# Patient Record
Sex: Male | Born: 1995 | Race: Black or African American | Hispanic: No | Marital: Single | Smoking: Current every day smoker
Health system: Southern US, Community
[De-identification: ages and names within clinical notes are randomized; demographics above are authoritative.]

## PROBLEM LIST (undated history)

## (undated) DIAGNOSIS — F329 Major depressive disorder, single episode, unspecified: Secondary | ICD-10-CM

## (undated) DIAGNOSIS — F32A Depression, unspecified: Secondary | ICD-10-CM

## (undated) DIAGNOSIS — F4310 Post-traumatic stress disorder, unspecified: Secondary | ICD-10-CM

## (undated) DIAGNOSIS — F209 Schizophrenia, unspecified: Secondary | ICD-10-CM

---
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
## 2015-03-07 ENCOUNTER — Encounter (HOSPITAL_COMMUNITY): Payer: Self-pay | Admitting: Emergency Medicine

## 2015-03-07 ENCOUNTER — Emergency Department (HOSPITAL_COMMUNITY): Payer: No Typology Code available for payment source

## 2015-03-07 ENCOUNTER — Emergency Department (HOSPITAL_COMMUNITY)
Admission: EM | Admit: 2015-03-07 | Discharge: 2015-03-07 | Disposition: A | Discharge status: Home / Self Care | Payer: No Typology Code available for payment source | Attending: Emergency Medicine | Admitting: Emergency Medicine

## 2015-03-07 DIAGNOSIS — Z720 Tobacco use: Secondary | ICD-10-CM | POA: Insufficient documentation

## 2015-03-07 DIAGNOSIS — S161XXA Strain of muscle, fascia and tendon at neck level, initial encounter: Secondary | ICD-10-CM | POA: Diagnosis not present

## 2015-03-07 DIAGNOSIS — Y998 Other external cause status: Secondary | ICD-10-CM | POA: Diagnosis not present

## 2015-03-07 DIAGNOSIS — S0990XA Unspecified injury of head, initial encounter: Secondary | ICD-10-CM | POA: Insufficient documentation

## 2015-03-07 DIAGNOSIS — Y9389 Activity, other specified: Secondary | ICD-10-CM | POA: Diagnosis not present

## 2015-03-07 DIAGNOSIS — Y92410 Unspecified street and highway as the place of occurrence of the external cause: Secondary | ICD-10-CM | POA: Insufficient documentation

## 2015-03-07 DIAGNOSIS — Z8659 Personal history of other mental and behavioral disorders: Secondary | ICD-10-CM | POA: Diagnosis not present

## 2015-03-07 DIAGNOSIS — S199XXA Unspecified injury of neck, initial encounter: Secondary | ICD-10-CM | POA: Diagnosis present

## 2015-03-07 HISTORY — DX: Major depressive disorder, single episode, unspecified: F32.9

## 2015-03-07 HISTORY — DX: Depression, unspecified: F32.A

## 2015-03-07 MED ORDER — IBUPROFEN 800 MG PO TABS: 800.0000 mg | ORAL_TABLET | Freq: Three times a day (TID) | ORAL | Status: DC

## 2015-03-07 MED ORDER — CYCLOBENZAPRINE HCL 10 MG PO TABS: 10.0000 mg | ORAL_TABLET | Freq: Two times a day (BID) | ORAL | Status: DC | PRN
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
## 2015-03-07 NOTE — Discharge Instructions (Signed)
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
## 2021-11-05 ENCOUNTER — Emergency Department (HOSPITAL_COMMUNITY): Payer: Medicaid Other

## 2021-11-05 ENCOUNTER — Inpatient Hospital Stay (HOSPITAL_COMMUNITY)
Admission: EM | Admit: 2021-11-05 | Discharge: 2021-11-14 | DRG: 957 | Disposition: A | Discharge status: Home / Self Care | Payer: Medicaid Other | Attending: General Surgery | Admitting: General Surgery

## 2021-11-05 ENCOUNTER — Other Ambulatory Visit: Payer: Self-pay

## 2021-11-05 ENCOUNTER — Inpatient Hospital Stay (HOSPITAL_COMMUNITY): Payer: Medicaid Other

## 2021-11-05 ENCOUNTER — Encounter (HOSPITAL_COMMUNITY): Admission: EM | Disposition: A | Discharge status: Home / Self Care | Payer: Self-pay | Source: Home / Self Care

## 2021-11-05 ENCOUNTER — Emergency Department (HOSPITAL_COMMUNITY): Payer: Medicaid Other | Admitting: Anesthesiology

## 2021-11-05 DIAGNOSIS — W3400XA Accidental discharge from unspecified firearms or gun, initial encounter: Principal | ICD-10-CM

## 2021-11-05 DIAGNOSIS — F259 Schizoaffective disorder, unspecified: Secondary | ICD-10-CM | POA: Diagnosis present

## 2021-11-05 DIAGNOSIS — S36299A Other injury of unspecified part of pancreas, initial encounter: Secondary | ICD-10-CM | POA: Diagnosis not present

## 2021-11-05 DIAGNOSIS — K8689 Other specified diseases of pancreas: Secondary | ICD-10-CM | POA: Diagnosis present

## 2021-11-05 DIAGNOSIS — S37062A Major laceration of left kidney, initial encounter: Secondary | ICD-10-CM | POA: Diagnosis present

## 2021-11-05 DIAGNOSIS — Z23 Encounter for immunization: Secondary | ICD-10-CM

## 2021-11-05 DIAGNOSIS — R748 Abnormal levels of other serum enzymes: Secondary | ICD-10-CM | POA: Diagnosis present

## 2021-11-05 DIAGNOSIS — S36531A Laceration of transverse colon, initial encounter: Secondary | ICD-10-CM | POA: Diagnosis present

## 2021-11-05 DIAGNOSIS — K567 Ileus, unspecified: Secondary | ICD-10-CM | POA: Diagnosis not present

## 2021-11-05 DIAGNOSIS — S36292A Other injury of tail of pancreas, initial encounter: Secondary | ICD-10-CM | POA: Diagnosis present

## 2021-11-05 DIAGNOSIS — S36591A Other injury of transverse colon, initial encounter: Secondary | ICD-10-CM | POA: Diagnosis not present

## 2021-11-05 DIAGNOSIS — K658 Other peritonitis: Secondary | ICD-10-CM | POA: Diagnosis present

## 2021-11-05 DIAGNOSIS — S2232XA Fracture of one rib, left side, initial encounter for closed fracture: Secondary | ICD-10-CM | POA: Diagnosis present

## 2021-11-05 DIAGNOSIS — S31641A Puncture wound with foreign body of abdominal wall, left upper quadrant with penetration into peritoneal cavity, initial encounter: Secondary | ICD-10-CM | POA: Diagnosis present

## 2021-11-05 DIAGNOSIS — Z933 Colostomy status: Secondary | ICD-10-CM

## 2021-11-05 DIAGNOSIS — S31631A Puncture wound without foreign body of abdominal wall, left upper quadrant with penetration into peritoneal cavity, initial encounter: Secondary | ICD-10-CM | POA: Diagnosis present

## 2021-11-05 HISTORY — PX: PARTIAL COLECTOMY: SHX5273

## 2021-11-05 HISTORY — PX: COLOSTOMY: SHX63

## 2021-11-05 HISTORY — PX: LAPAROTOMY: SHX154

## 2021-11-05 HISTORY — PX: SPLENECTOMY, TOTAL: SHX788

## 2021-11-05 LAB — URINALYSIS, ROUTINE W REFLEX MICROSCOPIC
Bilirubin Urine: NEGATIVE
Glucose, UA: NEGATIVE mg/dL
Ketones, ur: NEGATIVE mg/dL
Leukocytes,Ua: NEGATIVE
Nitrite: NEGATIVE
Protein, ur: 30 mg/dL — AB
RBC / HPF: >50 RBC/hpf — ABNORMAL HIGH (ref 0–5)
Specific Gravity, Urine: 1.013 (ref 1.005–1.030)
pH: 9 — ABNORMAL HIGH (ref 5.0–8.0)

## 2021-11-05 LAB — COMPREHENSIVE METABOLIC PANEL
ALT: 16 U/L (ref 0–44)
AST: 24 U/L (ref 15–41)
Albumin: 3.4 g/dL — ABNORMAL LOW (ref 3.5–5.0)
Alkaline Phosphatase: 80 U/L (ref 38–126)
Anion gap: 7 (ref 5–15)
BUN: 8 mg/dL (ref 6–20)
CO2: 23 mmol/L (ref 22–32)
Calcium: 8.3 mg/dL — ABNORMAL LOW (ref 8.9–10.3)
Chloride: 110 mmol/L (ref 98–111)
Creatinine, Ser: 1.01 mg/dL (ref 0.61–1.24)
GFR, Estimated: >60 mL/min (ref >60)
Glucose, Bld: 90 mg/dL (ref 70–99)
Potassium: 4.3 mmol/L (ref 3.5–5.1)
Sodium: 140 mmol/L (ref 135–145)
Total Bilirubin: 0.5 mg/dL (ref 0.3–1.2)
Total Protein: 5.5 g/dL — ABNORMAL LOW (ref 6.5–8.1)

## 2021-11-05 LAB — TYPE AND SCREEN
ABO/RH(D): B POS
Antibody Screen: NEGATIVE

## 2021-11-05 LAB — CBC
HCT: 43.7 % (ref 39.0–52.0)
Hemoglobin: 14.2 g/dL (ref 13.0–17.0)
MCH: 29.3 pg (ref 26.0–34.0)
MCHC: 32.5 g/dL (ref 30.0–36.0)
MCV: 90.1 fL (ref 80.0–100.0)
Platelets: 393 10*3/uL (ref 150–400)
RBC: 4.85 MIL/uL (ref 4.22–5.81)
RDW: 13.6 % (ref 11.5–15.5)
WBC: 12 10*3/uL — ABNORMAL HIGH (ref 4.0–10.5)
nRBC: 0 % (ref 0.0–0.2)

## 2021-11-05 LAB — ABO/RH: ABO/RH(D): B POS

## 2021-11-05 LAB — SAMPLE TO BLOOD BANK

## 2021-11-05 LAB — I-STAT CHEM 8, ED
BUN: 8 mg/dL (ref 6–20)
Calcium, Ion: 1 mmol/L — ABNORMAL LOW (ref 1.15–1.40)
Chloride: 106 mmol/L (ref 98–111)
Creatinine, Ser: 1 mg/dL (ref 0.61–1.24)
Glucose, Bld: 89 mg/dL (ref 70–99)
HCT: 42 % (ref 39.0–52.0)
Hemoglobin: 14.3 g/dL (ref 13.0–17.0)
Potassium: 4.2 mmol/L (ref 3.5–5.1)
Sodium: 139 mmol/L (ref 135–145)
TCO2: 24 mmol/L (ref 22–32)

## 2021-11-05 LAB — PROTIME-INR
INR: 1.1 (ref 0.8–1.2)
Prothrombin Time: 14.3 seconds (ref 11.4–15.2)

## 2021-11-05 LAB — LACTIC ACID, PLASMA: Lactic Acid, Venous: 2.1 mmol/L (ref 0.5–1.9)

## 2021-11-05 LAB — ETHANOL: Alcohol, Ethyl (B): <10 mg/dL (ref <10)

## 2021-11-05 SURGERY — COLECTOMY, PARTIAL
Anesthesia: General | Site: Abdomen

## 2021-11-05 MED ORDER — HALOPERIDOL LACTATE 5 MG/ML IJ SOLN: 5.0000 mg | Freq: Four times a day (QID) | INTRAMUSCULAR | Status: DC | PRN

## 2021-11-05 MED ORDER — CEFAZOLIN SODIUM-DEXTROSE 2-4 GM/100ML-% IV SOLN
2.0000 g | Freq: Once | INTRAVENOUS | Status: AC
  Administered 2021-11-05: 2 g via INTRAVENOUS

## 2021-11-05 MED ORDER — AMISULPRIDE (ANTIEMETIC) 5 MG/2ML IV SOLN: 10.0000 mg | Freq: Once | INTRAVENOUS | Status: DC | PRN

## 2021-11-05 MED ORDER — PROPOFOL 10 MG/ML IV BOLUS
INTRAVENOUS | Status: AC
  Filled 2021-11-05: qty 20

## 2021-11-05 MED ORDER — PANTOPRAZOLE SODIUM 40 MG PO TBEC: 40.0000 mg | DELAYED_RELEASE_TABLET | Freq: Every day | ORAL | Status: DC

## 2021-11-05 MED ORDER — HYDROMORPHONE HCL 1 MG/ML IJ SOLN
1.0000 mg | INTRAMUSCULAR | Status: DC | PRN
  Administered 2021-11-05: 2 mg via INTRAVENOUS
  Filled 2021-11-05: qty 2

## 2021-11-05 MED ORDER — MORPHINE SULFATE (PF) 4 MG/ML IV SOLN: 4.0000 mg | INTRAVENOUS | Status: DC | PRN

## 2021-11-05 MED ORDER — FENTANYL CITRATE PF 50 MCG/ML IJ SOSY
50.0000 ug | PREFILLED_SYRINGE | Freq: Once | INTRAMUSCULAR | Status: AC
  Administered 2021-11-05: 50 ug via INTRAVENOUS

## 2021-11-05 MED ORDER — OXYCODONE HCL 5 MG/5ML PO SOLN: 5.0000 mg | Freq: Once | ORAL | Status: DC | PRN

## 2021-11-05 MED ORDER — OXYCODONE HCL 5 MG PO TABS: 5.0000 mg | ORAL_TABLET | ORAL | Status: DC | PRN

## 2021-11-05 MED ORDER — LACTATED RINGERS IV SOLN: INTRAVENOUS | Status: DC

## 2021-11-05 MED ORDER — METHOCARBAMOL 1000 MG/10ML IJ SOLN
1000.0000 mg | Freq: Three times a day (TID) | INTRAVENOUS | Status: DC
  Administered 2021-11-05 – 2021-11-08 (×8): 1000 mg via INTRAVENOUS
  Filled 2021-11-05 (×3): qty 10
  Filled 2021-11-05: qty 1000
  Filled 2021-11-05 (×6): qty 10

## 2021-11-05 MED ORDER — DEXAMETHASONE SODIUM PHOSPHATE 10 MG/ML IJ SOLN
INTRAMUSCULAR | Status: AC
  Filled 2021-11-05: qty 2

## 2021-11-05 MED ORDER — SUGAMMADEX SODIUM 200 MG/2ML IV SOLN
INTRAVENOUS | Status: DC | PRN
  Administered 2021-11-05: 200 mg via INTRAVENOUS

## 2021-11-05 MED ORDER — PANTOPRAZOLE SODIUM 40 MG IV SOLR
40.0000 mg | Freq: Every day | INTRAVENOUS | Status: DC
  Administered 2021-11-05: 40 mg via INTRAVENOUS
  Filled 2021-11-05: qty 10

## 2021-11-05 MED ORDER — ADULT MULTIVITAMIN W/MINERALS CH
1.0000 | ORAL_TABLET | Freq: Every day | ORAL | Status: DC
  Administered 2021-11-08 – 2021-11-14 (×6): 1 via ORAL
  Filled 2021-11-05 (×8): qty 1

## 2021-11-05 MED ORDER — METOPROLOL TARTRATE 5 MG/5ML IV SOLN: 5.0000 mg | Freq: Four times a day (QID) | INTRAVENOUS | Status: DC | PRN

## 2021-11-05 MED ORDER — DEXMEDETOMIDINE (PRECEDEX) IN NS 20 MCG/5ML (4 MCG/ML) IV SYRINGE
PREFILLED_SYRINGE | INTRAVENOUS | Status: DC | PRN
  Administered 2021-11-05: 10 ug via INTRAVENOUS
  Administered 2021-11-05: 20 ug via INTRAVENOUS
  Administered 2021-11-05: 10 ug via INTRAVENOUS

## 2021-11-05 MED ORDER — ROCURONIUM BROMIDE 10 MG/ML (PF) SYRINGE
PREFILLED_SYRINGE | INTRAVENOUS | Status: DC | PRN
  Administered 2021-11-05: 100 mg via INTRAVENOUS

## 2021-11-05 MED ORDER — ONDANSETRON 4 MG PO TBDP: 4.0000 mg | ORAL_TABLET | Freq: Four times a day (QID) | ORAL | Status: DC | PRN

## 2021-11-05 MED ORDER — OXYCODONE HCL 5 MG PO TABS: 5.0000 mg | ORAL_TABLET | Freq: Once | ORAL | Status: DC | PRN

## 2021-11-05 MED ORDER — HYDROMORPHONE HCL 1 MG/ML IJ SOLN
INTRAMUSCULAR | Status: DC | PRN
  Administered 2021-11-05 (×2): .5 mg via INTRAVENOUS

## 2021-11-05 MED ORDER — MENINGOCOCCAL A C Y&W-135 OLIG IM SOLR
0.5000 mL | INTRAMUSCULAR | Status: AC | PRN
  Administered 2021-11-14: 0.5 mL via INTRAMUSCULAR
  Filled 2021-11-05 (×2): qty 0.5

## 2021-11-05 MED ORDER — FENTANYL CITRATE (PF) 100 MCG/2ML IJ SOLN
INTRAMUSCULAR | Status: AC
Start: 2021-11-05 — End: 2021-11-06
  Filled 2021-11-05: qty 2

## 2021-11-05 MED ORDER — ONDANSETRON HCL 4 MG/2ML IJ SOLN
INTRAMUSCULAR | Status: AC
  Filled 2021-11-05: qty 2

## 2021-11-05 MED ORDER — LIDOCAINE 2% (20 MG/ML) 5 ML SYRINGE
INTRAMUSCULAR | Status: AC
  Filled 2021-11-05: qty 5

## 2021-11-05 MED ORDER — SUCCINYLCHOLINE CHLORIDE 200 MG/10ML IV SOSY
PREFILLED_SYRINGE | INTRAVENOUS | Status: AC
  Filled 2021-11-05: qty 10

## 2021-11-05 MED ORDER — METHOCARBAMOL 1000 MG/10ML IJ SOLN
500.0000 mg | Freq: Three times a day (TID) | INTRAVENOUS | Status: DC
  Filled 2021-11-05: qty 5

## 2021-11-05 MED ORDER — HYDROMORPHONE HCL 1 MG/ML IJ SOLN
0.2500 mg | INTRAMUSCULAR | Status: DC | PRN
  Administered 2021-11-05 (×3): 0.5 mg via INTRAVENOUS

## 2021-11-05 MED ORDER — PIPERACILLIN-TAZOBACTAM 3.375 G IVPB
3.3750 g | Freq: Three times a day (TID) | INTRAVENOUS | Status: DC
  Filled 2021-11-05: qty 50

## 2021-11-05 MED ORDER — MIDAZOLAM HCL 2 MG/2ML IJ SOLN
INTRAMUSCULAR | Status: AC
  Filled 2021-11-05: qty 2

## 2021-11-05 MED ORDER — LACTATED RINGERS IV SOLN: INTRAVENOUS | Status: DC | PRN

## 2021-11-05 MED ORDER — IOHEXOL 300 MG/ML  SOLN
50.0000 mL | Freq: Once | INTRAMUSCULAR | Status: AC | PRN
  Administered 2021-11-05: 50 mL

## 2021-11-05 MED ORDER — TETANUS-DIPHTH-ACELL PERTUSSIS 5-2.5-18.5 LF-MCG/0.5 IM SUSY
0.5000 mL | PREFILLED_SYRINGE | Freq: Once | INTRAMUSCULAR | Status: AC
  Administered 2021-11-05: 0.5 mL via INTRAMUSCULAR

## 2021-11-05 MED ORDER — FOLIC ACID 1 MG PO TABS
1.0000 mg | ORAL_TABLET | Freq: Every day | ORAL | Status: DC
  Administered 2021-11-08 – 2021-11-14 (×6): 1 mg via ORAL
  Filled 2021-11-05 (×8): qty 1

## 2021-11-05 MED ORDER — MIDAZOLAM HCL 2 MG/2ML IJ SOLN
INTRAMUSCULAR | Status: DC | PRN
  Administered 2021-11-05: 2 mg via INTRAVENOUS

## 2021-11-05 MED ORDER — MEPERIDINE HCL 25 MG/ML IJ SOLN: 6.2500 mg | INTRAMUSCULAR | Status: DC | PRN

## 2021-11-05 MED ORDER — LORAZEPAM 2 MG/ML IJ SOLN
1.0000 mg | INTRAMUSCULAR | Status: AC | PRN
  Administered 2021-11-05 – 2021-11-06 (×4): 2 mg via INTRAVENOUS
  Filled 2021-11-05 (×4): qty 1

## 2021-11-05 MED ORDER — KETAMINE HCL 50 MG/5ML IJ SOSY
PREFILLED_SYRINGE | INTRAMUSCULAR | Status: AC
  Filled 2021-11-05: qty 5

## 2021-11-05 MED ORDER — SODIUM CHLORIDE 0.9 % IV SOLN: INTRAVENOUS | Status: DC | PRN

## 2021-11-05 MED ORDER — HYDROMORPHONE HCL 1 MG/ML IJ SOLN
INTRAMUSCULAR | Status: AC
  Filled 2021-11-05: qty 1

## 2021-11-05 MED ORDER — PROPOFOL 10 MG/ML IV BOLUS
INTRAVENOUS | Status: DC | PRN
  Administered 2021-11-05: 50 mg via INTRAVENOUS
  Administered 2021-11-05: 200 mg via INTRAVENOUS
  Administered 2021-11-05: 50 mg via INTRAVENOUS

## 2021-11-05 MED ORDER — KETAMINE HCL 10 MG/ML IJ SOLN
INTRAMUSCULAR | Status: DC | PRN
  Administered 2021-11-05: 50 mg via INTRAVENOUS

## 2021-11-05 MED ORDER — ONDANSETRON HCL 4 MG/2ML IJ SOLN
4.0000 mg | Freq: Once | INTRAMUSCULAR | Status: AC
  Administered 2021-11-05: 4 mg via INTRAVENOUS

## 2021-11-05 MED ORDER — ALBUMIN HUMAN 5 % IV SOLN: INTRAVENOUS | Status: DC | PRN

## 2021-11-05 MED ORDER — DEXMEDETOMIDINE (PRECEDEX) IN NS 20 MCG/5ML (4 MCG/ML) IV SYRINGE
PREFILLED_SYRINGE | INTRAVENOUS | Status: AC
  Filled 2021-11-05: qty 5

## 2021-11-05 MED ORDER — IOHEXOL 300 MG/ML  SOLN
100.0000 mL | Freq: Once | INTRAMUSCULAR | Status: AC | PRN
  Administered 2021-11-05: 100 mL via INTRAVENOUS

## 2021-11-05 MED ORDER — HYDROMORPHONE HCL 1 MG/ML IJ SOLN
INTRAMUSCULAR | Status: AC
Start: 2021-11-05 — End: 2021-11-06
  Filled 2021-11-05: qty 1

## 2021-11-05 MED ORDER — HYDROMORPHONE HCL 1 MG/ML IJ SOLN
1.0000 mg | Freq: Once | INTRAMUSCULAR | Status: AC
  Administered 2021-11-05: 1 mg via INTRAVENOUS
  Filled 2021-11-05: qty 1

## 2021-11-05 MED ORDER — ONDANSETRON HCL 4 MG/2ML IJ SOLN: 4.0000 mg | Freq: Four times a day (QID) | INTRAMUSCULAR | Status: DC | PRN

## 2021-11-05 MED ORDER — THIAMINE HCL 100 MG PO TABS
100.0000 mg | ORAL_TABLET | Freq: Every day | ORAL | Status: DC
  Administered 2021-11-08 – 2021-11-13 (×5): 100 mg via ORAL
  Filled 2021-11-05 (×7): qty 1

## 2021-11-05 MED ORDER — LIDOCAINE 2% (20 MG/ML) 5 ML SYRINGE
INTRAMUSCULAR | Status: DC | PRN
  Administered 2021-11-05: 60 mg via INTRAVENOUS

## 2021-11-05 MED ORDER — OXYCODONE HCL 5 MG PO TABS
10.0000 mg | ORAL_TABLET | ORAL | Status: DC | PRN
  Administered 2021-11-08 – 2021-11-14 (×20): 10 mg via ORAL
  Filled 2021-11-05 (×22): qty 2

## 2021-11-05 MED ORDER — THIAMINE HCL 100 MG/ML IJ SOLN
100.0000 mg | Freq: Every day | INTRAMUSCULAR | Status: DC
  Administered 2021-11-05 – 2021-11-14 (×4): 100 mg via INTRAVENOUS
  Filled 2021-11-05 (×4): qty 2

## 2021-11-05 MED ORDER — ONDANSETRON HCL 4 MG/2ML IJ SOLN
INTRAMUSCULAR | Status: DC | PRN
Start: 2021-11-05 — End: 2021-11-05
  Administered 2021-11-05: 4 mg via INTRAVENOUS

## 2021-11-05 MED ORDER — PIPERACILLIN-TAZOBACTAM 3.375 G IVPB
3.3750 g | Freq: Three times a day (TID) | INTRAVENOUS | Status: AC
Start: 2021-11-05 — End: 2021-11-10
  Administered 2021-11-05 – 2021-11-10 (×15): 3.375 g via INTRAVENOUS
  Filled 2021-11-05 (×15): qty 50

## 2021-11-05 MED ORDER — 0.9 % SODIUM CHLORIDE (POUR BTL) OPTIME
TOPICAL | Status: DC | PRN
  Administered 2021-11-05 (×5): 1000 mL

## 2021-11-05 MED ORDER — PNEUMOCOCCAL 13-VAL CONJ VACC IM SUSP
0.5000 mL | INTRAMUSCULAR | Status: AC | PRN
  Administered 2021-11-14: 0.5 mL via INTRAMUSCULAR
  Filled 2021-11-05 (×2): qty 0.5

## 2021-11-05 MED ORDER — LORAZEPAM 1 MG PO TABS: 1.0000 mg | ORAL_TABLET | ORAL | Status: AC | PRN

## 2021-11-05 MED ORDER — SUCCINYLCHOLINE CHLORIDE 200 MG/10ML IV SOSY
PREFILLED_SYRINGE | INTRAVENOUS | Status: DC | PRN
  Administered 2021-11-05: 120 mg via INTRAVENOUS

## 2021-11-05 MED ORDER — FENTANYL CITRATE (PF) 250 MCG/5ML IJ SOLN
INTRAMUSCULAR | Status: AC
  Filled 2021-11-05: qty 5

## 2021-11-05 MED ORDER — FENTANYL CITRATE (PF) 100 MCG/2ML IJ SOLN
INTRAMUSCULAR | Status: AC
  Filled 2021-11-05: qty 2

## 2021-11-05 MED ORDER — MORPHINE SULFATE (PF) 2 MG/ML IV SOLN
2.0000 mg | INTRAVENOUS | Status: DC | PRN
  Administered 2021-11-05: 4 mg via INTRAVENOUS
  Administered 2021-11-06 (×2): 2 mg via INTRAVENOUS
  Administered 2021-11-06: 4 mg via INTRAVENOUS
  Administered 2021-11-06: 2 mg via INTRAVENOUS
  Administered 2021-11-06 – 2021-11-08 (×13): 4 mg via INTRAVENOUS
  Filled 2021-11-05 (×7): qty 2
  Filled 2021-11-05 (×2): qty 1
  Filled 2021-11-05 (×6): qty 2
  Filled 2021-11-05: qty 1
  Filled 2021-11-05 (×2): qty 2

## 2021-11-05 MED ORDER — ENOXAPARIN SODIUM 30 MG/0.3ML IJ SOSY
30.0000 mg | PREFILLED_SYRINGE | Freq: Two times a day (BID) | INTRAMUSCULAR | Status: DC
  Administered 2021-11-06: 30 mg via SUBCUTANEOUS
  Filled 2021-11-05 (×6): qty 0.3

## 2021-11-05 MED ORDER — HAEMOPHILUS B POLYSAC CONJ VAC 10 MCG IJ SOLR
0.5000 mL | INTRAMUSCULAR | Status: AC | PRN
Start: 2021-11-07 — End: 2021-11-14
  Administered 2021-11-14: 0.5 mL via INTRAMUSCULAR
  Filled 2021-11-05 (×4): qty 0.5

## 2021-11-05 MED ORDER — ACETAMINOPHEN 10 MG/ML IV SOLN
1000.0000 mg | Freq: Four times a day (QID) | INTRAVENOUS | Status: AC
  Administered 2021-11-06 (×4): 1000 mg via INTRAVENOUS
  Filled 2021-11-05 (×4): qty 100

## 2021-11-05 MED ORDER — HYDROMORPHONE HCL 1 MG/ML IJ SOLN
INTRAMUSCULAR | Status: AC
  Filled 2021-11-05: qty 0.5

## 2021-11-05 MED ORDER — FENTANYL CITRATE PF 50 MCG/ML IJ SOSY: 50.0000 ug | PREFILLED_SYRINGE | INTRAMUSCULAR | Status: DC | PRN

## 2021-11-05 MED ORDER — HALOPERIDOL LACTATE 5 MG/ML IJ SOLN
10.0000 mg | Freq: Four times a day (QID) | INTRAMUSCULAR | Status: DC | PRN
  Administered 2021-11-08 – 2021-11-09 (×2): 10 mg via INTRAVENOUS
  Filled 2021-11-05 (×2): qty 2

## 2021-11-05 MED ORDER — ONDANSETRON HCL 4 MG/2ML IJ SOLN
INTRAMUSCULAR | Status: AC
  Filled 2021-11-05: qty 4

## 2021-11-05 MED ORDER — PROMETHAZINE HCL 25 MG/ML IJ SOLN: 6.2500 mg | INTRAMUSCULAR | Status: DC | PRN

## 2021-11-05 MED ORDER — FENTANYL CITRATE (PF) 250 MCG/5ML IJ SOLN
INTRAMUSCULAR | Status: DC | PRN
Start: 2021-11-05 — End: 2021-11-05
  Administered 2021-11-05 (×3): 50 ug via INTRAVENOUS
  Administered 2021-11-05: 100 ug via INTRAVENOUS

## 2021-11-05 MED ORDER — ROCURONIUM BROMIDE 10 MG/ML (PF) SYRINGE
PREFILLED_SYRINGE | INTRAVENOUS | Status: AC
  Filled 2021-11-05: qty 10

## 2021-11-05 SURGICAL SUPPLY — 61 items
BIOPATCH RED 1 DISK 7.0 (GAUZE/BANDAGES/DRESSINGS) ×2 IMPLANT
BLADE CLIPPER SURG (BLADE) ×1 IMPLANT
CANISTER SUCT 3000ML PPV (MISCELLANEOUS) ×3 IMPLANT
COVER SURGICAL LIGHT HANDLE (MISCELLANEOUS) ×4 IMPLANT
DRAIN CHANNEL 19F RND (DRAIN) ×2 IMPLANT
DRAPE LAPAROSCOPIC ABDOMINAL (DRAPES) ×3 IMPLANT
DRAPE WARM FLUID 44X44 (DRAPES) ×3 IMPLANT
DRSG OPSITE POSTOP 4X10 (GAUZE/BANDAGES/DRESSINGS) ×1 IMPLANT
DRSG OPSITE POSTOP 4X8 (GAUZE/BANDAGES/DRESSINGS) IMPLANT
DRSG TEGADERM 4X4.75 (GAUZE/BANDAGES/DRESSINGS) ×1 IMPLANT
ELECT BLADE 4.0 EZ CLEAN MEGAD (MISCELLANEOUS) ×3
ELECT BLADE 6.5 EXT (BLADE) ×1 IMPLANT
ELECT CAUTERY BLADE 6.4 (BLADE) ×3 IMPLANT
ELECT REM PT RETURN 9FT ADLT (ELECTROSURGICAL) ×3
ELECTRODE BLDE 4.0 EZ CLN MEGD (MISCELLANEOUS) IMPLANT
ELECTRODE REM PT RTRN 9FT ADLT (ELECTROSURGICAL) ×2 IMPLANT
EVACUATOR SILICONE 100CC (DRAIN) ×2 IMPLANT
GAUZE PACKING IODOFORM 1/4X15 (PACKING) ×1 IMPLANT
GAUZE SPONGE 4X4 12PLY STRL (GAUZE/BANDAGES/DRESSINGS) ×1 IMPLANT
GLOVE BIO SURGEON STRL SZ 6.5 (GLOVE) ×1 IMPLANT
GLOVE SURG ENC MOIS LTX SZ6.5 (GLOVE) ×3 IMPLANT
GLOVE SURG UNDER POLY LF SZ6 (GLOVE) ×3 IMPLANT
GOWN STRL REUS W/ TWL LRG LVL3 (GOWN DISPOSABLE) ×4 IMPLANT
GOWN STRL REUS W/TWL LRG LVL3 (GOWN DISPOSABLE) ×2
HANDLE SUCTION POOLE (INSTRUMENTS) ×2 IMPLANT
KIT BASIN OR (CUSTOM PROCEDURE TRAY) ×3 IMPLANT
KIT OSTOMY DRAINABLE 2.75 STR (WOUND CARE) ×1 IMPLANT
KIT TURNOVER KIT B (KITS) ×3 IMPLANT
LIGASURE IMPACT 36 18CM CVD LR (INSTRUMENTS) ×1 IMPLANT
NS IRRIG 1000ML POUR BTL (IV SOLUTION) ×6 IMPLANT
PACK GENERAL/GYN (CUSTOM PROCEDURE TRAY) ×3 IMPLANT
PAD ARMBOARD 7.5X6 YLW CONV (MISCELLANEOUS) ×3 IMPLANT
PENCIL SMOKE EVACUATOR (MISCELLANEOUS) ×3 IMPLANT
RELOAD PROXIMATE 75MM BLUE (ENDOMECHANICALS) ×3 IMPLANT
RELOAD STAPLE 60 2.6 WHT THN (STAPLE) IMPLANT
RELOAD STAPLE 60 4.1 GRN THCK (STAPLE) IMPLANT
RELOAD STAPLE 75 3.8 BLU REG (ENDOMECHANICALS) IMPLANT
RELOAD STAPLER GREEN 60MM (STAPLE) ×4 IMPLANT
RELOAD STAPLER WHITE 60MM (STAPLE) ×2 IMPLANT
SPONGE T-LAP 18X18 ~~LOC~~+RFID (SPONGE) ×5 IMPLANT
STAPLE ECHEON FLEX 60 POW ENDO (STAPLE) ×1 IMPLANT
STAPLE LINE REINFORCEMENT LAP (STAPLE) ×2 IMPLANT
STAPLER PROXIMATE 75MM BLUE (STAPLE) ×1 IMPLANT
STAPLER RELOAD GREEN 60MM (STAPLE) ×6
STAPLER RELOAD WHITE 60MM (STAPLE) ×3
STAPLER VISISTAT 35W (STAPLE) ×3 IMPLANT
SUCTION POOLE HANDLE (INSTRUMENTS) ×6
SUT ETHILON 2 0 FS 18 (SUTURE) ×2 IMPLANT
SUT PDS AB 1 TP1 54 (SUTURE) IMPLANT
SUT PDS AB 1 TP1 96 (SUTURE) ×2 IMPLANT
SUT PROLENE 2 0 SH 30 (SUTURE) ×1 IMPLANT
SUT SILK 2 0 SH CR/8 (SUTURE) ×3 IMPLANT
SUT SILK 2 0 TIES 10X30 (SUTURE) ×3 IMPLANT
SUT SILK 3 0 SH CR/8 (SUTURE) ×3 IMPLANT
SUT SILK 3 0 TIES 10X30 (SUTURE) ×3 IMPLANT
SUT VIC AB 2-0 SH 18 (SUTURE) ×2 IMPLANT
SUT VIC AB 3-0 SH 18 (SUTURE) ×1 IMPLANT
TAPE CLOTH SURG 4X10 WHT LF (GAUZE/BANDAGES/DRESSINGS) ×1 IMPLANT
TOWEL GREEN STERILE (TOWEL DISPOSABLE) ×3 IMPLANT
TRAY FOLEY MTR SLVR 16FR STAT (SET/KITS/TRAYS/PACK) ×1 IMPLANT
YANKAUER SUCT BULB TIP NO VENT (SUCTIONS) ×1 IMPLANT

## 2021-11-05 NOTE — ED Notes (Signed)
Patient transported to CT 

## 2021-11-05 NOTE — Anesthesia Procedure Notes (Signed)
Procedure Name: Intubation ?Date/Time: 11/05/2021 3:10 PM ?Performed by: Rosiland Oz, CRNA ?Pre-anesthesia Checklist: Patient identified, Emergency Drugs available, Suction available, Patient being monitored and Timeout performed ?Patient Re-evaluated:Patient Re-evaluated prior to induction ?Oxygen Delivery Method: Circle system utilized ?Preoxygenation: Pre-oxygenation with 100% oxygen ?Induction Type: IV induction, Rapid sequence and Cricoid Pressure applied ?Laryngoscope Size: Hyacinth Meeker and 3 ?Grade View: Grade I ?Tube type: Oral ?Tube size: 7.5 mm ?Number of attempts: 1 ?Airway Equipment and Method: Stylet ?Placement Confirmation: ETT inserted through vocal cords under direct vision, positive ETCO2 and breath sounds checked- equal and bilateral ?Secured at: 22 cm ?Tube secured with: Tape ?Dental Injury: Teeth and Oropharynx as per pre-operative assessment  ? ? ? ? ?

## 2021-11-05 NOTE — Anesthesia Postprocedure Evaluation (Signed)
Anesthesia Post Note ? ?Patient: [name] ? ?Procedure(s) Performed: PARTIAL COLECTOMY WITH REMOVAL SPLENIC FLEXURE (Abdomen) ?COLOSTOMY ?EXPLORATORY LAPAROTOMY (Abdomen) ?DISTAL PARTIAL PANCREATECTOMY (Abdomen) ?SPLENECTOMY (Abdomen) ? ?  ? ?Patient location during evaluation: PACU ?Anesthesia Type: General ?Level of consciousness: awake and alert ?Pain management: pain level controlled ?Vital Signs Assessment: post-procedure vital signs reviewed and stable ?Respiratory status: spontaneous breathing, nonlabored ventilation, respiratory function stable and patient connected to nasal cannula oxygen ?Cardiovascular status: blood pressure returned to baseline and stable ?Postop Assessment: no apparent nausea or vomiting ?Anesthetic complications: no ? ? ?No notable events documented. ? ?Last Vitals:  ?Vitals:  ? 11/05/21 1910 11/05/21 1925  ?BP: 118/67 135/86  ?Pulse: 75 82  ?Resp: (!) 22 17  ?Temp:    ?SpO2: 98% 100%  ?  ?Last Pain:  ?Vitals:  ? 11/05/21 1925  ?TempSrc:   ?PainSc: Asleep  ? ? ?  ?  ?  ?  ?  ?  ? ?Audry Pili ? ? ? ? ?

## 2021-11-05 NOTE — Anesthesia Preprocedure Evaluation (Signed)
Anesthesia Evaluation  Patient identified by MRN, date of birth, ID band Patient awake    Reviewed: Allergy & Precautions, NPO status , Patient's Chart, lab work & pertinent test results  Airway Mallampati: II  TM Distance: >3 FB Neck ROM: Full    Dental no notable dental hx.    Pulmonary neg pulmonary ROS,    Pulmonary exam normal breath sounds clear to auscultation       Cardiovascular negative cardio ROS Normal cardiovascular exam Rhythm:Regular Rate:Normal     Neuro/Psych negative neurological ROS  negative psych ROS   GI/Hepatic negative GI ROS, Neg liver ROS,   Endo/Other  negative endocrine ROS  Renal/GU negative Renal ROS  negative genitourinary   Musculoskeletal negative musculoskeletal ROS (+)   Abdominal   Peds negative pediatric ROS (+)  Hematology negative hematology ROS (+)   Anesthesia Other Findings   Reproductive/Obstetrics negative OB ROS                             Anesthesia Physical Anesthesia Plan  ASA: 3 and emergent  Anesthesia Plan: General   Post-op Pain Management: Dilaudid IV   Induction: Intravenous, Rapid sequence and Cricoid pressure planned  PONV Risk Score and Plan: 2 and Ondansetron, Midazolam and Treatment may vary due to age or medical condition  Airway Management Planned: Oral ETT  Additional Equipment:   Intra-op Plan:   Post-operative Plan: Extubation in OR  Informed Consent: I have reviewed the patients History and Physical, chart, labs and discussed the procedure including the risks, benefits and alternatives for the proposed anesthesia with the patient or authorized representative who has indicated his/her understanding and acceptance.     Dental advisory given  Plan Discussed with: CRNA  Anesthesia Plan Comments:         Anesthesia Quick Evaluation  

## 2021-11-05 NOTE — Progress Notes (Signed)
Lactic Acid: 2.1 MD made aware 
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
## 2021-11-05 NOTE — Transfer of Care (Signed)
Immediate Anesthesia Transfer of Care Note ? ?Patient: Dalton Moore ? ?Procedure(s) Performed: PARTIAL COLECTOMY WITH REMOVAL SPLENIC FLEXURE (Abdomen) ?COLOSTOMY ?EXPLORATORY LAPAROTOMY (Abdomen) ?DISTAL PARTIAL PANCREATECTOMY (Abdomen) ?SPLENECTOMY (Abdomen) ? ?Patient Location: PACU ? ?Anesthesia Type:General ? ?Level of Consciousness: drowsy ? ?Airway & Oxygen Therapy: Patient Spontanous Breathing ? ?Post-op Assessment: Report given to RN and Post -op Vital signs reviewed and stable ? ?Post vital signs: Reviewed and stable ? ?Last Vitals:  ?Vitals Value Taken Time  ?BP 133/81 11/05/21 1826  ?Temp    ?Pulse 78 11/05/21 1833  ?Resp 17 11/05/21 1833  ?SpO2 95 % 11/05/21 1833  ?Vitals shown include unvalidated device data. ? ?Last Pain:  ?Vitals:  ? 11/05/21 1415  ?TempSrc:   ?PainSc: 10-Worst pain ever  ?   ? ?  ? ?Complications: No notable events documented. ?
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
## 2021-11-05 NOTE — Progress Notes (Signed)
Patient refused 2200 blood work. He did not want anybody touching him. Will attempt to collect blood work at 0500.  ?

## 2021-11-06 ENCOUNTER — Encounter (HOSPITAL_COMMUNITY): Payer: Self-pay | Admitting: Surgery

## 2021-11-06 DIAGNOSIS — F1910 Other psychoactive substance abuse, uncomplicated: Secondary | ICD-10-CM

## 2021-11-06 DIAGNOSIS — W3400XA Accidental discharge from unspecified firearms or gun, initial encounter: Secondary | ICD-10-CM

## 2021-11-06 LAB — COMPREHENSIVE METABOLIC PANEL
ALT: 38 U/L (ref 0–44)
AST: 51 U/L — ABNORMAL HIGH (ref 15–41)
Albumin: 3.6 g/dL (ref 3.5–5.0)
Alkaline Phosphatase: 65 U/L (ref 38–126)
Anion gap: 8 (ref 5–15)
BUN: 8 mg/dL (ref 6–20)
CO2: 24 mmol/L (ref 22–32)
Calcium: 8.9 mg/dL (ref 8.9–10.3)
Chloride: 104 mmol/L (ref 98–111)
Creatinine, Ser: 1.04 mg/dL (ref 0.61–1.24)
GFR, Estimated: >60 mL/min (ref >60)
Glucose, Bld: 137 mg/dL — ABNORMAL HIGH (ref 70–99)
Potassium: 4.8 mmol/L (ref 3.5–5.1)
Sodium: 136 mmol/L (ref 135–145)
Total Bilirubin: 1 mg/dL (ref 0.3–1.2)
Total Protein: 5.5 g/dL — ABNORMAL LOW (ref 6.5–8.1)

## 2021-11-06 LAB — CBC
HCT: 47.5 % (ref 39.0–52.0)
Hemoglobin: 15.5 g/dL (ref 13.0–17.0)
MCH: 29 pg (ref 26.0–34.0)
MCHC: 32.6 g/dL (ref 30.0–36.0)
MCV: 89 fL (ref 80.0–100.0)
Platelets: 396 10*3/uL (ref 150–400)
RBC: 5.34 MIL/uL (ref 4.22–5.81)
RDW: 13.7 % (ref 11.5–15.5)
WBC: 12 10*3/uL — ABNORMAL HIGH (ref 4.0–10.5)
nRBC: 0 % (ref 0.0–0.2)

## 2021-11-06 LAB — RAPID URINE DRUG SCREEN, HOSP PERFORMED
Amphetamines: NOT DETECTED
Barbiturates: NOT DETECTED
Benzodiazepines: POSITIVE — AB
Cocaine: NOT DETECTED
Opiates: POSITIVE — AB
Tetrahydrocannabinol: POSITIVE — AB

## 2021-11-06 LAB — AMYLASE: Amylase: 66 U/L (ref 28–100)

## 2021-11-06 LAB — HIV ANTIBODY (ROUTINE TESTING W REFLEX): HIV Screen 4th Generation wRfx: NONREACTIVE
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
## 2021-11-06 NOTE — Progress Notes (Signed)
PT Cancellation Note ? ?Patient Details ?Name: [name] ?MRN: [id number] ?DOB: 01-05-1996 ? ? ?Cancelled Treatment:    Reason Eval/Treat Not Completed: Patient declined, no reason specified. Pt refusing all mobility at this time, reports he will try tomorrow. PT attempts to provide education on the importance of early mobility however pt continues to repeat "tomorrow". PT will attempt to follow up as time allows. ? ? ?Arlyss Gandy ?11/06/2021, 1:29 PM ?
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
## 2021-11-06 NOTE — Consult Note (Signed)
WOC Nurse Consult Note: ?Patient receiving care in [facility] 4N06 ?Reason for Consult: Initiate NPWT ?Wound type: Surgical mid-abd incision ?Pressure Injury POA: NA ?Measurement: 14.3 cm x 3.3 cm x 1 cm ?Wound bed: pink/red with fatty globules throughout.  ?Drainage (amount, consistency, odor) Serous on dressing. ?Periwound: intact ?Dressing procedure/placement/frequency: ?Ostomy and midline incision are in very close proximity. May need to change pouch out to a flexible one piece.  ?Removed gauze dressing. Placed one piece of black foam into the wound. Drape applied, immediate suction obtained at 125 mmHg.  ? ?MWF vac dressing change. WOC will follow with ostomy education.  ? ?Renaldo Reel. Katrinka Blazing, MSN, RN, CMSRN, AGCNS, WTA ?Wound Treatment Associate ?Pager [phone]  ? ? ?  ?

## 2021-11-06 NOTE — Consult Note (Signed)
[place] Nurse ostomy consult note ?Patient receiving care in [facility] 4N06 ?Stoma type/location:  LMQ transverse colostomy ?Stomal assessment/size: 2" budded 2" above skin level ?Peristomal assessment: intact ?Treatment options for stomal/peristomal skin: barrier ring ?Output: bloody mucus ?Ostomy pouching: 2pc. 2 3/4" Kellie Simmering # [phone]) ?Skin barrier (lawson # 2) Barrier rings Kellie Simmering # [phone]) ?Education provided: None ?Enrolled patient in [place] program: No  ? ?Thank you for the consult. [facility] nurse will follow for education and teaching. Please re-consult the [place] team if needed. ? ?Cathlean Marseilles. Tamala Julian, MSN, RN, CMSRN, AGCNS, WTA ?Wound Treatment Associate ?Pager [phone]   ?
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
## 2021-11-06 NOTE — Progress Notes (Signed)
Attempted to interact with patient to complete admission assessment.   He was sleeping and shook his head and refused to participate. ?

## 2021-11-07 LAB — CBC
HCT: 36 % — ABNORMAL LOW (ref 39.0–52.0)
Hemoglobin: 12.2 g/dL — ABNORMAL LOW (ref 13.0–17.0)
MCH: 29 pg (ref 26.0–34.0)
MCHC: 33.9 g/dL (ref 30.0–36.0)
MCV: 85.7 fL (ref 80.0–100.0)
Platelets: 373 10*3/uL (ref 150–400)
RBC: 4.2 MIL/uL — ABNORMAL LOW (ref 4.22–5.81)
RDW: 13.7 % (ref 11.5–15.5)
WBC: 17.9 10*3/uL — ABNORMAL HIGH (ref 4.0–10.5)
nRBC: 0 % (ref 0.0–0.2)

## 2021-11-07 LAB — BASIC METABOLIC PANEL
Anion gap: 6 (ref 5–15)
BUN: 8 mg/dL (ref 6–20)
CO2: 26 mmol/L (ref 22–32)
Calcium: 8.6 mg/dL — ABNORMAL LOW (ref 8.9–10.3)
Chloride: 102 mmol/L (ref 98–111)
Creatinine, Ser: 0.9 mg/dL (ref 0.61–1.24)
GFR, Estimated: >60 mL/min (ref >60)
Glucose, Bld: 97 mg/dL (ref 70–99)
Potassium: 3.8 mmol/L (ref 3.5–5.1)
Sodium: 134 mmol/L — ABNORMAL LOW (ref 135–145)

## 2021-11-07 LAB — AMYLASE, BODY FLUID (OTHER)
Amylase, Body Fluid: 1582 U/L
Amylase, Body Fluid: 1142 U/L
Amylase, Body Fluid: 813 U/L

## 2021-11-07 LAB — SURGICAL PATHOLOGY

## 2021-11-07 MED ORDER — HALOPERIDOL 1 MG PO TABS
5.0000 mg | ORAL_TABLET | Freq: Every day | ORAL | Status: DC
  Administered 2021-11-08 – 2021-11-13 (×5): 5 mg via ORAL
  Filled 2021-11-07 (×5): qty 5

## 2021-11-07 NOTE — Progress Notes (Signed)
Pt refused labs this morning.  Will notify MD. ?
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
## 2021-11-07 NOTE — Progress Notes (Signed)
Mobility Specialist: Progress Note ? ? 11/07/21 1556  ?Mobility  ?Activity Refused mobility  ? ?Pt refused mobility with c/o 10/10 abdominal pain. Despite encouragement pt still refused. Will f/u as able.  ? ?Cristal Deer Savonna Birchmeier ?Mobility Specialist ?Mobility Specialist 5 [address]: [phone] ?Mobility Specialist 6 [address]: [phone] ? ?
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
## 2021-11-07 NOTE — Progress Notes (Addendum)
Patient refused lab work this morning.  ? ?Lab returned at 0550 and patient allowed them to get his blood work.  ?

## 2021-11-08 LAB — CBC
HCT: 35.7 % — ABNORMAL LOW (ref 39.0–52.0)
Hemoglobin: 12 g/dL — ABNORMAL LOW (ref 13.0–17.0)
MCH: 29.1 pg (ref 26.0–34.0)
MCHC: 33.6 g/dL (ref 30.0–36.0)
MCV: 86.7 fL (ref 80.0–100.0)
Platelets: 376 10*3/uL (ref 150–400)
RBC: 4.12 MIL/uL — ABNORMAL LOW (ref 4.22–5.81)
RDW: 13.6 % (ref 11.5–15.5)
WBC: 17.3 10*3/uL — ABNORMAL HIGH (ref 4.0–10.5)
nRBC: 0 % (ref 0.0–0.2)

## 2021-11-08 LAB — BASIC METABOLIC PANEL
Anion gap: 9 (ref 5–15)
BUN: 9 mg/dL (ref 6–20)
CO2: 24 mmol/L (ref 22–32)
Calcium: 8.6 mg/dL — ABNORMAL LOW (ref 8.9–10.3)
Chloride: 101 mmol/L (ref 98–111)
Creatinine, Ser: 0.76 mg/dL (ref 0.61–1.24)
GFR, Estimated: >60 mL/min (ref >60)
Glucose, Bld: 101 mg/dL — ABNORMAL HIGH (ref 70–99)
Potassium: 3.6 mmol/L (ref 3.5–5.1)
Sodium: 134 mmol/L — ABNORMAL LOW (ref 135–145)

## 2021-11-08 MED ORDER — KETOROLAC TROMETHAMINE 15 MG/ML IJ SOLN
30.0000 mg | Freq: Four times a day (QID) | INTRAMUSCULAR | Status: DC
  Administered 2021-11-08 – 2021-11-09 (×5): 30 mg via INTRAVENOUS
  Filled 2021-11-08 (×6): qty 2

## 2021-11-08 MED ORDER — MORPHINE SULFATE (PF) 2 MG/ML IV SOLN: 2.0000 mg | INTRAVENOUS | Status: DC | PRN

## 2021-11-08 MED ORDER — ACETAMINOPHEN 500 MG PO TABS
1000.0000 mg | ORAL_TABLET | Freq: Four times a day (QID) | ORAL | Status: DC
  Administered 2021-11-08 – 2021-11-14 (×14): 1000 mg via ORAL
  Filled 2021-11-08 (×20): qty 2

## 2021-11-08 MED ORDER — METHOCARBAMOL 500 MG PO TABS
1000.0000 mg | ORAL_TABLET | Freq: Three times a day (TID) | ORAL | Status: DC
  Administered 2021-11-08 – 2021-11-13 (×9): 1000 mg via ORAL
  Filled 2021-11-08 (×13): qty 2

## 2021-11-08 MED ORDER — POTASSIUM CHLORIDE 20 MEQ PO PACK
40.0000 meq | PACK | Freq: Once | ORAL | Status: AC
  Administered 2021-11-08: 40 meq via ORAL
  Filled 2021-11-08: qty 2
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
## 2021-11-08 NOTE — Progress Notes (Signed)
Pt refused morning labs; was reeducated and told lab to come back in morning.  Rescheduled labs for 8am ?

## 2021-11-08 NOTE — Consult Note (Addendum)
WOC Nurse Follow Up Note: ?Patient receiving care in [facility] 4N06 ?Wound type: Surgical mid-abd incision ?Pressure Injury POA: NA ?Wound bed: pink/yellow/tan with fatty globules throughout.  ?Drainage (amount, consistency, odor) Serous in canister ?Periwound: intact ?Dressing procedure/placement/frequency: ?Ostomy and midline incision are in very close proximity.Removed one piece black foam. Placed one piece of black foam into the wound. Drape applied, immediate suction obtained at 125 mmHg.  ?  ?MWF vac dressing change. WOC will follow with ostomy education. Not teachable at this point. He sleeps and is roused with pain and tries to push my hand away. Pouch not changed today. Output is thin pink.  ?  ?Cathlean Marseilles. Tamala Julian, MSN, RN, CMSRN, AGCNS, WTA ?Wound Treatment Associate ?Pager [phone]  ?
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
## 2021-11-08 NOTE — Progress Notes (Signed)
Left arm IV infilitrated; iv removed, tip intact.  Ice applied to edematous arm for comfort. ?
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
## 2021-11-08 NOTE — TOC CAGE-AID Note (Signed)
Transition of Care (TOC) - CAGE-AID Screening ? ? ?Patient Details  ?Name: Dalton Moore ?MRN: [id number] ?Date of Birth: 18-Jan-1996 ? ?Transition of Care (TOC) CM/SW Contact:    ?Joscelyne Renville C Tarpley-Carter, LCSWA ?Phone Number: ?11/08/2021, 1:07 PM ? ? ?Clinical Narrative: ?Pt refused assessment.  CSW will attempt assessment at another time.  CSW will provide pt with resources. ? ?Insurance underwriter, MSW, LCSW-A ?Pronouns:  She/Her/Hers ?[facility] ?Clinical Social Worker ?Direct Number:  [phone] ?[email or web address] ? ? ? ?CAGE-AID Screening: ?Substance Abuse Screening unable to be completed due to: : Patient Refused ? ?  ?  ?  ?  ?  ? ?  ? ?  ? ? ? ? ? ? ?

## 2021-11-09 ENCOUNTER — Other Ambulatory Visit: Payer: Self-pay

## 2021-11-09 ENCOUNTER — Encounter (HOSPITAL_COMMUNITY): Payer: Self-pay

## 2021-11-09 LAB — BASIC METABOLIC PANEL
Anion gap: 5 (ref 5–15)
BUN: 8 mg/dL (ref 6–20)
CO2: 27 mmol/L (ref 22–32)
Calcium: 8.4 mg/dL — ABNORMAL LOW (ref 8.9–10.3)
Chloride: 104 mmol/L (ref 98–111)
Creatinine, Ser: 0.84 mg/dL (ref 0.61–1.24)
GFR, Estimated: >60 mL/min (ref >60)
Glucose, Bld: 91 mg/dL (ref 70–99)
Potassium: 3.7 mmol/L (ref 3.5–5.1)
Sodium: 136 mmol/L (ref 135–145)

## 2021-11-09 LAB — CBC
HCT: 32.5 % — ABNORMAL LOW (ref 39.0–52.0)
Hemoglobin: 11 g/dL — ABNORMAL LOW (ref 13.0–17.0)
MCH: 29.1 pg (ref 26.0–34.0)
MCHC: 33.8 g/dL (ref 30.0–36.0)
MCV: 86 fL (ref 80.0–100.0)
Platelets: 460 10*3/uL — ABNORMAL HIGH (ref 150–400)
RBC: 3.78 MIL/uL — ABNORMAL LOW (ref 4.22–5.81)
RDW: 13.8 % (ref 11.5–15.5)
WBC: 13 10*3/uL — ABNORMAL HIGH (ref 4.0–10.5)
nRBC: 0 % (ref 0.0–0.2)

## 2021-11-09 NOTE — Progress Notes (Signed)
Mobility Specialist: Progress Note ? ? 11/09/21 1225  ?Mobility  ?Activity Ambulated with assistance in room  ?Level of Assistance Standby assist, set-up cues, supervision of patient - no hands on  ?Assistive Device Other (Comment) ?(IV pole)  ?Distance Ambulated (ft) 132 ft  ?Activity Response Tolerated well  ?$Mobility charge 1 Mobility  ? ?Received pt in bed having no complaints and agreeable to mobility. Asymptomatic throughout ambulation, returned back to bed w/ call bell in reach and all needs met. ? ?Dalton Moore ?Mobility Specialist ?Mobility Specialist [place]: [phone] ?Mobility Specialist [place]: [phone] ? ?
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
## 2021-11-09 NOTE — Progress Notes (Signed)
..  Trauma Event Note ? ? ? ?Reason for Call : ?Rounding on pt ? ? ?Initial Focused Assessment: ? ?Pt reports pain, RN at bedside administering pain medication. ? ?Interventions: ? ?Empty JP drain per pt request x 2 ? ?Plan of Care: ?Continue to monitor ? ? ?Event Summary: ?While rounding on pt, he requested TRN empty JP drains, small amount of serosanguinous fluid emptied from each. Primary RN at bedside. Pt also requesting TRN retrieve cranberry juice, pt provided juice. No other needs at this time.  ?Primary RN updated.  ? ? ? ? ?Last imported Vital Signs ?BP (!) 161/78 (BP Location: Left Leg)   Pulse 73   Temp 98.2 ?F (36.8 ?C) (Oral)   Resp 15   Ht 6\' 2"  (1.88 m)   Wt 172 lb (78 kg)   SpO2 96%   BMI 22.08 kg/m?  ? ?Trending CBC ?Recent Labs  ?  11/07/21 ?1557 11/08/21 ?11/10/21 11/09/21 ?1633  ?WBC 17.9* 17.3* 13.0*  ?HGB 12.2* 12.0* 11.0*  ?HCT 36.0* 35.7* 32.5*  ?PLT 373 376 460*  ? ? ?Trending Coag's ?No results for input(s): APTT, INR in the last 72 hours. ? ?Trending BMET ?Recent Labs  ?  11/07/21 ?1557 11/08/21 ?11/10/21 11/09/21 ?1633  ?NA 134* 134* 136  ?K 3.8 3.6 3.7  ?CL 102 101 104  ?CO2 26 24 27   ?BUN 8 9 8   ?CREATININE 0.90 0.76 0.84  ?GLUCOSE 97 101* 91  ? ? ? ? ?Salimatou Simone Dee  ?Trauma Response RN ? ?Please call TRN at [phone] for further assistance. ? ? ?  ?

## 2021-11-10 LAB — BASIC METABOLIC PANEL
Anion gap: 7 (ref 5–15)
BUN: 9 mg/dL (ref 6–20)
CO2: 26 mmol/L (ref 22–32)
Calcium: 8.3 mg/dL — ABNORMAL LOW (ref 8.9–10.3)
Chloride: 104 mmol/L (ref 98–111)
Creatinine, Ser: 0.9 mg/dL (ref 0.61–1.24)
GFR, Estimated: >60 mL/min (ref >60)
Glucose, Bld: 99 mg/dL (ref 70–99)
Potassium: 3.6 mmol/L (ref 3.5–5.1)
Sodium: 137 mmol/L (ref 135–145)

## 2021-11-10 NOTE — Progress Notes (Signed)
Mobility Specialist: Progress Note ? ? 11/10/21 1656  ?Mobility  ?Activity Refused mobility  ? ?Pt refused mobility stating he has walked today and his mother will be here soon. Will f/u as able.  ? ?Harrell Gave Laneice Meneely ?Mobility Specialist ?Mobility Specialist Rule: [phone] ?Mobility Specialist [place]: [phone] ? ?

## 2021-11-10 NOTE — Progress Notes (Signed)
Refused to have wound care to abdominal site done during and also refused Ketorolac iv . ?
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
## 2021-11-11 MED ORDER — RIVAROXABAN 10 MG PO TABS
10.0000 mg | ORAL_TABLET | Freq: Every day | ORAL | Status: DC
  Administered 2021-11-12 – 2021-11-14 (×3): 10 mg via ORAL
  Filled 2021-11-11 (×4): qty 1

## 2021-11-11 MED ORDER — NICOTINE 14 MG/24HR TD PT24
14.0000 mg | MEDICATED_PATCH | Freq: Every day | TRANSDERMAL | Status: DC
  Administered 2021-11-11: 14 mg via TRANSDERMAL
  Filled 2021-11-11 (×4): qty 1

## 2021-11-11 NOTE — Progress Notes (Signed)
PT Cancellation Note ? ?Patient Details ?Name: Dalton Moore ?MRN: [id number] ?DOB: [birth year] ? ? ?Cancelled Treatment:    Reason Eval/Treat Not Completed: Patient declined, no reason specified; attempted x 2.  First wanting to take a nap so returned at specified time and pt refusing states doesn't want to get up, doesn't feel well.  Multiple attempts to encourage, push to participate, but pt kept stating he is allowed to refuse and he is refusing.  Will continue attempts.  ? ? ?Elray Mcgregor ?11/11/2021, 4:20 PM ?Sheran Lawless, PT ?Acute Rehabilitation Services ?[phone] ?Office:[phone] ?11/11/2021 ? ?
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
## 2021-11-11 NOTE — Consult Note (Signed)
WOC Nurse Follow Up Note: ?Patient receiving care in [facility] 4N06 ?Wound type: Surgical mid-abd incision ?Pressure Injury POA: NA ?Measurement: 13.2 cm x 3.3 cm x 0.5 cm ?Wound bed: pink/red with minor bleeding  ?Drainage (amount, consistency, odor) Serous in canister ?Periwound: intact ?Dressing procedure/placement/frequency: ?Ostomy and midline incision are in very close proximity.Removed one piece black foam. Discontinued the vac today. (Discussed this with Barkley Boards, PA-C) Replaced with moistened saline gauze covered with ABD pad and secured with Medipore tape  ?WOC will follow with ostomy education. Not teachable at this point. He sleeps and is aroused with pain and tries to push my hand away.  ? ?[facility] Nurse ostomy follow up ?Stoma type/location: LMQ transverse colostomy ?Stomal assessment/size: 2" budded, 2" above skin level, pink and moist ?Peristomal assessment: intact ?Treatment options for stomal/peristomal skin: Skin barrier ?Output: soft brown ?Ostomy pouching: 2pc. 2 3/4" Kellie Simmering # [phone]) skin barrier Kellie Simmering # 2) barrier ring Kellie Simmering # [phone]) ?Education provided:  ?Explained role of ostomy nurse and creation of stoma  ?Explained stoma characteristics (budded, flush, color, texture, care) ?Demonstrated pouch change (cutting new skin barrier, measuring stoma, cleaning peristomal skin and stoma, use of barrier ring) ?Education on emptying when 1/3 to 1/2 full and how to empty ?Answered patient/family questions:  Explained to patient that [facility] team will return on Thursday afternoon to start with teaching and education.  ? ?Enrolled patient in [facility] Discharge program: No  (still do not know where he will discharge to) ? ?Cathlean Marseilles Tamala Julian, MSN, RN, CMSRN, AGCNS, WTA ?Wound Treatment Associate ?Pager [phone]  ?
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
## 2021-11-12 MED ORDER — KETOROLAC TROMETHAMINE 15 MG/ML IJ SOLN: 30.0000 mg | Freq: Four times a day (QID) | INTRAMUSCULAR | Status: DC | PRN
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
## 2021-11-12 NOTE — Progress Notes (Signed)
Mobility Specialist Progress Note  ? ? 11/12/21 1448  ?Mobility  ?Activity Refused mobility  ? ?Pt stated he already got up today and is not going to get up and walk again.  ? ?[place] ?Mobility Specialist  ?Primary: 5[address] M.S. Phone: [phone] ?Secondary: 6N M.S. Phone: [phone] ?  ?
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
## 2021-11-13 NOTE — Progress Notes (Signed)
Mobility Specialist: Progress Note ? ? 11/13/21 1120  ?Mobility  ?Activity Refused mobility  ? ?Pt refused mobility with c/o abdominal pain stating he will walk later today. Will f/u as able.  ? ?Cristal Deer Maryan Sivak ?Mobility Specialist ?Mobility Specialist [address]: [phone] ?Mobility Specialist 6 [address]: [phone] ? ?
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
## 2021-11-13 NOTE — Progress Notes (Addendum)
1950-Pt at this time has become increasingly agitated with this RN, after RN tried to recheck patient BP at 1950. Pt stated that this RN had the BP cuff "tight on leg that it clotted off his leg". RN tried to obtain a BP on upper extremities however, pt refused this. RN educate patient that BP cuff can be tight when obtaining a BP but its normally temporary feeling. Pt was still upset and accused this RN of doing it on purpose. ? ? ? ?38- RN brought pain medication to patient. Pt still agitated with RN from previous encounter. Allowed RN to give medication but refused this RN to empty drains, colostomy bag or change abdominal wound dressing. RN spoke to [employer] about this encounter. This RN will try again at next vital time.  ? ?0433-Pt more pleasant but still agitated. Allowed RN to complete dressing change and empty drains and ostomy bag.  ?

## 2021-11-13 NOTE — Progress Notes (Signed)
Trauma Event Note ? ? ?TRN rounded on patient. Pt stable at this time, VS WDL. Checked in with primary RN, who has no needs for TRN at this time.  ? ?Last imported Vital Signs ?BP (!) 171/73 (BP Location: Left Leg)   Pulse 60   Temp 98.5 ?F (36.9 ?C) (Oral)   Resp 18   Ht 6\' 2"  (1.88 m)   Wt 172 lb (78 kg)   SpO2 99%   BMI 22.08 kg/m?  ? ?Trending CBC ?No results for input(s): WBC, HGB, HCT, PLT in the last 72 hours. ? ?Trending Coag's ?No results for input(s): APTT, INR in the last 72 hours. ? ?Trending BMET ?Recent Labs  ?  11/10/21 ?1032  ?NA 137  ?K 3.6  ?CL 104  ?CO2 26  ?BUN 9  ?CREATININE 0.90  ?GLUCOSE 99  ? ? ? ? ?[facility] O [facility]  ?Trauma Response RN ? ?Please call TRN at [phone] for further assistance. ? ? ?  ?

## 2021-11-14 ENCOUNTER — Other Ambulatory Visit (HOSPITAL_COMMUNITY): Payer: Self-pay

## 2021-11-14 DIAGNOSIS — F209 Schizophrenia, unspecified: Secondary | ICD-10-CM

## 2021-11-14 LAB — AMYLASE, BODY FLUID (OTHER)
Amylase, Body Fluid: 137 U/L
Amylase, Body Fluid: >75000 U/L

## 2021-11-14 MED ORDER — BUPROPION HCL ER (XL) 150 MG PO TB24
150.0000 mg | ORAL_TABLET | Freq: Every day | ORAL | Status: DC
  Administered 2021-11-14: 150 mg via ORAL
  Filled 2021-11-14: qty 1

## 2021-11-14 MED ORDER — ACETAMINOPHEN 500 MG PO TABS: 1000.0000 mg | ORAL_TABLET | Freq: Three times a day (TID) | ORAL | Status: DC | PRN

## 2021-11-14 MED ORDER — NALOXONE HCL 4 MG/0.1ML NA LIQD
NASAL | 0 refills | Status: DC
  Filled 2021-11-14: qty 2, 30d supply, fill #0

## 2021-11-14 MED ORDER — OXYCODONE HCL 5 MG PO TABS
5.0000 mg | ORAL_TABLET | ORAL | 0 refills | Status: DC | PRN
  Filled 2021-11-14: qty 30, 3d supply, fill #0

## 2021-11-14 MED ORDER — BUPROPION HCL ER (XL) 150 MG PO TB24
150.0000 mg | ORAL_TABLET | Freq: Every day | ORAL | 0 refills | Status: DC
Start: 2021-11-15 — End: 2022-07-04
  Filled 2021-11-14: qty 30, 30d supply, fill #0

## 2021-11-14 MED ORDER — METHOCARBAMOL 500 MG PO TABS
1000.0000 mg | ORAL_TABLET | Freq: Three times a day (TID) | ORAL | 0 refills | Status: DC | PRN
  Filled 2021-11-14: qty 60, 10d supply, fill #0

## 2021-11-14 MED ORDER — HALOPERIDOL DECANOATE 100 MG/ML IM SOLN
62.5000 mg | Freq: Once | INTRAMUSCULAR | Status: AC
  Administered 2021-11-14: 62.5 mg via INTRAMUSCULAR
  Filled 2021-11-14: qty 0.63

## 2021-11-14 NOTE — Progress Notes (Signed)
Mobility Specialist: Progress Note ? ? 11/14/21 1335  ?Mobility  ?Activity Refused mobility  ? ?Pt refused mobility stating he has already walked today and isn't going to get up any more. Will f/u as able.  ? ?Cristal Deer Kairee Kozma ?Mobility Specialist ?Mobility Specialist 5 North: [phone] ?Mobility Specialist 6 North: [phone] ? ?

## 2021-11-14 NOTE — Progress Notes (Addendum)
11am: ?CSW spoke with Dr. Lovette Cliche who states patient can be discharged today.  ? ?CSW completed cab voucher and gave it to Network engineer to call [facility] when ready. CSW provided patient with two PART bus tickets to get to follow up appointments. CSW provided patient with resources for [facility]. ? ?CSW spoke with Marjory Lies of [facility] who states the agency cannot provide any charity [facility] services for this patient. ? ?8am: ?Patient added to DTP list for discharge planning. ? ?CSW met with patient at bedside to introduce self and role. Patient was completely covered by a blanket throughout conversation, patient would not uncover himself. Patient states he cannot return to live with his grandmother. Patient states his mother is responsible for his disability check, but does not know the amount of the check. Patient agreeable for CSW to speak with his mother to assist with discharge planning. Patient states he is not schizophrenic but that he has PTSD instead. Patient states he was not taking any medications prior to hospitalization.  ? ?CSW attempted to reach patient's mother without success - a voicemail was left requesting a return call. ? ?Madilyn Fireman, MSW, LCSW ?Transitions of Care  Clinical Social Worker II ?[phone] ? ?
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
## 2021-11-14 NOTE — TOC Transition Note (Signed)
Transition of Care (TOC) - CM/SW Discharge Note ? ? ?Patient Details  ?Name: Dalton Moore ?MRN: [id number] ?Date of Birth: 12-05-1995 ? ?Transition of Care [facility]) CM/SW Contact:  ?Glennon Mac, RN ?Phone Number: ?11/14/2021, 3:56 PM ? ? ?Clinical Narrative:    ?Pt medically stable for discharge home today per attending and psych MDs.  Patient has safe discharge plan to return home with his grandmother.  Patient to follow up with [facility] in [place] [address] on 12/09/2021, as prior to admission.  Pt is uninsured, but is eligible for medication assistance through [facility] program. DC Rx [facility] to be filled using MATCH letter; meds to be delivered to bedside prior to dc. ? ?Final next level of care: Home/Self Care ?Barriers to Discharge: Barriers Resolved ? ? ?           ?  ?  ?  ?  ? ?Discharge Plan and Services ?In-house Referral: Clinical Social Work ?Discharge Planning Services: CM Consult, MATCH Program, Medication Assistance ?           ?  ?  ?  ?  ?  ?  ?  ?  ?  ?  ? ?Social Determinants of Health (SDOH) Interventions ?  ? ? ?Readmission Risk Interventions ?   ? View : No data to display.  ?  ?  ?  ? ? ?Quintella Baton, RN, BSN  ?Trauma/Neuro ICU Case Manager ?[phone] ? ? ? ? ?

## 2021-11-14 NOTE — Consult Note (Addendum)
WOC Nurse ostomy follow up ?Patient receiving care in [facility] 4N06 ?Due to be discharged today. Going back to Grandmother's residence. ?Stoma type/location: LMQ transverse colostomy ?Stomal assessment/size: 1 3/4" budded ?Peristomal assessment: intact ?Treatment options for stomal/peristomal skin: Skin barrier ?Output: soft brown ?Ostomy pouching: 2pc. 2 3/4" [place] # [phone]) skin barrier [place] # 2) barrier ring [place] # [phone]) ?Education provided:  ?Explained stoma characteristics (budded, flush, color, texture, care) ?Demonstrated pouch change (cutting new skin barrier, measuring stoma, cleaning peristomal skin and stoma, use of barrier ring) patient cut pouch to size, opened and closed pouch twice. Stretched the barrier ring to size. ?Education on emptying when 1/3 to 1/2 full and how to empty. Changing pouch twice a week.  ?Burping pouch to let gas out ?Answered patient/family questions:    ?Handout given "Eating with an Ostomy" ([employer] [place] www.ostomy.org (2022)    ?  ?Enrolled patient in [facility] Secure Start Discharge program: Yes ?Welcome to [facility]! ?Thank you for choosing to enroll. A member of our team will be in touch shortly to explain our services, which include: ?Personalized support from a Information systems manager ?Product, supplier, and insurance information ?Condition-specific information and connection to community resources ?If you have questions now, or in the future, please call us at [phone]. ? ?If you need assistance with free pouches after discharge from the hospital, you will need to contact [facility] directly. Free pouches will only be supplied to those patients with no type of insurance.  Follow these instructions: ?First: Call [phone] ?Press 2: End User ?Press 1: Ostomy ?Press 5: Consumer Assistance Program ? ?This service is only for 3 months and will provide you with limited types of ostomy supplies to meet your needs.   You will have to contact [facility] directly, the ostomy nurse cannot assist with this process. ? ?I recommend/discussed the [facility] outpatient ostomy clinic as an outpatient resource.  IF MD agrees this would be beneficial, please fax referral, or enter electronically in Epic the referral. (Fax- [phone])  ?  ?Renaldo Reel Katrinka Blazing, MSN, RN, CMSRN, AGCNS, WTA ?Wound Treatment Associate ?Pager [phone]  ?
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
## 2021-11-14 NOTE — Progress Notes (Signed)
PT Cancellation Note ? ?Patient Details ?Name: Dalton Moore ?MRN: [id number] ?DOB: 05/04/1996 ? ? ?Cancelled Treatment:    Reason Eval/Treat Not Completed: Other (comment); planned to see with OT who planned to help with shower, but pt already in shower, then upon return, pt up walking halls asking about when to be discharged.  Noted no LOB or issues with mobility at this time.  Also noted for d/c home today.  PT to sign off.  ? ? ?Elray Mcgregor ?11/14/2021, 3:38 PM ?Sheran Lawless, PT ?Acute Rehabilitation Services ?[phone] ?Office:[phone] ?11/14/2021 ? ?
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
## 2021-11-14 NOTE — Discharge Instructions (Addendum)
Psychiatry ? ?If you ever experience suicidal or homicidal thoughts, do not hesitate to call 911 or go to the nearest ED. ? ?You will need to go to [facility] at [address] [place], [place], [place] [address] for a hospital follow up appointment since your medications have changed, this is between 8-5 M-F. In addition, your next long acting injectable is due on 12/09/21; can show up at any time on this date. ? ?If you need assistance with free pouches after discharge from the hospital, you will need to contact [facility] directly. Free pouches will only be supplied to those patients with no type of insurance.  Follow these instructions: ?First: Call [phone] ?Press 2: End User ?Press 1: Ostomy ?Press 5: Consumer Assistance Program ?  ?This service is only for 3 months and will provide you with limited types of ostomy supplies to meet your needs.  You will have to contact [facility] directly, the ostomy nurse cannot assist with this process. ? ? ?WOUND CARE: ?- dressing to be changed twice daily ?- supplies: sterile saline, kerlix/guaze, scissors, ABD pads, tape  ?- remove dressing and all packing carefully, moistening with sterile saline as needed to avoid packing/internal dressing sticking to the wound. ?- clean edges of skin around the wound with water/gauze, making sure there is no tape debris or leakage left on skin that could cause skin irritation or breakdown. ?- dampen clean kerlix/gauze with sterile saline and pack wound from wound base to skin level, making sure to take note of any possible areas of wound tracking, tunneling and packing appropriately. Wound can be packed loosely. Trim kerlix/gauze to size if a whole roll/piece is not required. ?- cover wound with a dry ABD pad and secure with tape.  ?- write the date/time on the dry dressing/tape to better track when the last dressing change occurred. ?- apply any skin protectant/powder recommended by clinician to protect skin/skin folds. ?- change dressing as  needed if leakage occurs, wound gets contaminated, or patient requests to shower. ?- patient may shower daily with wound open and following the shower the wound should be dried and a clean dressing placed.   ? ?[facility]      [facility], [place] ?[phone] ? ?OPEN ABDOMINAL SURGERY: POST OP INSTRUCTIONS ? ?Always review your discharge instruction sheet given to you by the facility where your surgery was performed. ? ?IF YOU HAVE DISABILITY OR FAMILY LEAVE FORMS, YOU MUST BRING THEM TO THE OFFICE FOR PROCESSING.  PLEASE DO NOT GIVE THEM TO YOUR DOCTOR. ? ?A prescription for pain medication may be given to you upon discharge.  Take your pain medication as prescribed, if needed.  If narcotic pain medicine is not needed, then you may take acetaminophen (Tylenol) or ibuprofen (Advil) as needed. ?Take your usually prescribed medications unless otherwise directed. ?If you need a refill on your pain medication, please contact your pharmacy. They will contact our office to request authorization.  Prescriptions will not be filled after 5pm or on week-ends. ?You should follow a light diet the first few days after arrival home, such as soup and crackers, pudding, etc.unless your doctor has advised otherwise. A high-fiber, low fat diet can be resumed as tolerated.   Be sure to include lots of fluids daily. Most patients will experience some swelling and bruising on the chest and neck area.  Ice packs will help.  Swelling and bruising can take several days to resolve ?Most patients will experience some swelling and bruising in the area of  the incision. Ice pack will help. Swelling and bruising can take several days to resolve.Marland Kitchen  ?It is common to experience some constipation if taking pain medication after surgery.  Increasing fluid intake and taking a stool softener will usually help or prevent this problem from occurring.  A mild laxative (Milk of Magnesia or Miralax) should be taken according to package directions if  there are no bowel movements after 48 hours. ? You may have steri-strips (small skin tapes) in place directly over the incision.  These strips should be left on the skin for 7-10 days.  If your surgeon used skin glue on the incision, you may shower in 24 hours.  The glue will flake off over the next 2-3 weeks.  Any sutures or staples will be removed at the office during your follow-up visit. You may find that a light gauze bandage over your incision may keep your staples from being rubbed or pulled. You may shower and replace the bandage daily. ?ACTIVITIES:  You may resume regular (light) daily activities beginning the next day--such as daily self-care, walking, climbing stairs--gradually increasing activities as tolerated.  You may have sexual intercourse when it is comfortable.  Refrain from any heavy lifting or straining until approved by your doctor. ?You may drive when you no longer are taking prescription pain medication, you can comfortably wear a seatbelt, and you can safely maneuver your car and apply brakes ? ?You should see your doctor in the office for a follow-up appointment approximately two weeks after your surgery.  Make sure that you call for this appointment within a day or two after you arrive home to insure a convenient appointment time. ? ?WHEN TO CALL YOUR DOCTOR: ?Fever over 101.0 ?Inability to urinate ?Nausea and/or vomiting ?Extreme swelling or bruising ?Continued bleeding from incision. ?Increased pain, redness, or drainage from the incision. ?Difficulty swallowing or breathing ?Muscle cramping or spasms. ?Numbness or tingling in hands or feet or around lips. ? ?The clinic staff is available to answer your questions during regular business hours.  Please don?t hesitate to call and ask to speak to one of the nurses if you have concerns. ? ?For further questions, please visit [email or web address]  ?
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
## 2021-11-14 NOTE — Progress Notes (Addendum)
This RN  went into pt room to bring schedule medication along with pain medication and to change abdominal dressing. Pt declined the schedule medication tylenol and robaxin and only wanted oxycodone. When RN was going to do dressing change pt stated "didn't we do this last night?". This RN educated pt that the dressing changes need to be done twice daily. Pt still refused and told RN to come back later. ? ?Pt also refused 0400 vitals from tech and RN. ?

## 2021-11-19 ENCOUNTER — Inpatient Hospital Stay (HOSPITAL_COMMUNITY)
Admission: EM | Admit: 2021-11-19 | Discharge: 2021-11-23 | DRG: 439 | Disposition: A | Discharge status: Home / Self Care | Payer: Medicaid Other | Attending: Surgery | Admitting: Surgery

## 2021-11-19 DIAGNOSIS — F17210 Nicotine dependence, cigarettes, uncomplicated: Secondary | ICD-10-CM | POA: Diagnosis present

## 2021-11-19 DIAGNOSIS — D72829 Elevated white blood cell count, unspecified: Secondary | ICD-10-CM | POA: Diagnosis present

## 2021-11-19 DIAGNOSIS — T8132XA Disruption of internal operation (surgical) wound, not elsewhere classified, initial encounter: Secondary | ICD-10-CM | POA: Diagnosis present

## 2021-11-19 DIAGNOSIS — Z9081 Acquired absence of spleen: Secondary | ICD-10-CM

## 2021-11-19 DIAGNOSIS — Y838 Other surgical procedures as the cause of abnormal reaction of the patient, or of later complication, without mention of misadventure at the time of the procedure: Secondary | ICD-10-CM | POA: Diagnosis present

## 2021-11-19 DIAGNOSIS — K8689 Other specified diseases of pancreas: Principal | ICD-10-CM | POA: Diagnosis present

## 2021-11-19 DIAGNOSIS — J95811 Postprocedural pneumothorax: Secondary | ICD-10-CM | POA: Diagnosis not present

## 2021-11-19 DIAGNOSIS — Z79899 Other long term (current) drug therapy: Secondary | ICD-10-CM

## 2021-11-19 DIAGNOSIS — Z90411 Acquired partial absence of pancreas: Secondary | ICD-10-CM

## 2021-11-19 DIAGNOSIS — J90 Pleural effusion, not elsewhere classified: Secondary | ICD-10-CM | POA: Diagnosis present

## 2021-11-19 DIAGNOSIS — B999 Unspecified infectious disease: Principal | ICD-10-CM

## 2021-11-19 DIAGNOSIS — D75839 Thrombocytosis, unspecified: Secondary | ICD-10-CM | POA: Diagnosis present

## 2021-11-19 DIAGNOSIS — Z933 Colostomy status: Secondary | ICD-10-CM

## 2021-11-19 MED ORDER — OXYCODONE HCL 5 MG PO TABS
10.0000 mg | ORAL_TABLET | Freq: Once | ORAL | Status: AC
  Administered 2021-11-20: 10 mg via ORAL
  Filled 2021-11-19: qty 2

## 2021-11-19 NOTE — Discharge Instructions (Addendum)
Go to [facility] on 12/09/2021; For long acting injectible psychiatric medication and psychiatric care. ?Go to [facility] on 12/05/2021; Follow up appointment scheduled for 9:00 AM. Please arrive 30 min prior to appointment time for check in. Bring photo ID and any insurance information with you. ?Follow up with [facility]); The ostomy clinic should be working on scheduling a follow up appointment, please call if you do not hear from them in the next week. ?Call Department, [facility] in 8 weeks (01/09/2022); For second pneumococcal vaccine after splenectomy ? ? ?Wet to Dry WOUND CARE: ?- Change dressing twice daily ?- Supplies: sterile saline, kerlex, scissors, ABD pads, tape  ?Remove dressing and all packing carefully, moistening with sterile saline as needed to avoid packing/internal dressing sticking to the wound. ?2.   Clean edges of skin around the wound with water/gauze, making sure there is no tape debris or leakage left on skin that could cause skin irritation or breakdown. ?3.   Dampen and clean kerlex with sterile saline and pack wound from wound base to skin level, making sure to take note of any possible areas of wound tracking, tunneling and packing appropriately. Wound can be packed loosely. Trim kerlex to size if a whole kerlex is not required. ?4.   Cover wound with a dry ABD pad and secure with tape.  ?5.   Write the date/time on the dry dressing/tape to better track when the last dressing change occurred. ?- apply any skin protectant/powder if recommended by clinician to protect skin/skin folds. ?- change dressing as needed if leakage occurs, wound gets contaminated, or patient requests to shower. ?- You may shower daily with wound open and following the shower the wound should be dried and a clean dressing placed.  ?- Medical grade tape as well as packing supplies can be found at [employer]  on [address] or [employer] on [address]. The remaining supplies can be found at your local drug store, [employer] etc. ? ? ? ?
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
## 2021-11-19 NOTE — ED Notes (Addendum)
On assessment of pt, pt denies pain, endorses "some fever" at home, but denies any issues w healing. Pt states he only needs dressings changed. ?
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
## 2021-11-20 ENCOUNTER — Observation Stay (HOSPITAL_COMMUNITY): Payer: Medicaid Other

## 2021-11-20 ENCOUNTER — Emergency Department (HOSPITAL_COMMUNITY): Payer: Medicaid Other

## 2021-11-20 ENCOUNTER — Encounter (HOSPITAL_COMMUNITY): Payer: Self-pay

## 2021-11-20 DIAGNOSIS — D72829 Elevated white blood cell count, unspecified: Secondary | ICD-10-CM | POA: Diagnosis present

## 2021-11-20 LAB — COMPREHENSIVE METABOLIC PANEL
ALT: 81 U/L — ABNORMAL HIGH (ref 0–44)
AST: 47 U/L — ABNORMAL HIGH (ref 15–41)
Albumin: 2.4 g/dL — ABNORMAL LOW (ref 3.5–5.0)
Alkaline Phosphatase: 89 U/L (ref 38–126)
Anion gap: 8 (ref 5–15)
BUN: 11 mg/dL (ref 6–20)
CO2: 26 mmol/L (ref 22–32)
Calcium: 8.7 mg/dL — ABNORMAL LOW (ref 8.9–10.3)
Chloride: 99 mmol/L (ref 98–111)
Creatinine, Ser: 0.94 mg/dL (ref 0.61–1.24)
GFR, Estimated: >60 mL/min (ref >60)
Glucose, Bld: 195 mg/dL — ABNORMAL HIGH (ref 70–99)
Potassium: 3.6 mmol/L (ref 3.5–5.1)
Sodium: 133 mmol/L — ABNORMAL LOW (ref 135–145)
Total Bilirubin: 0.3 mg/dL (ref 0.3–1.2)
Total Protein: 6.2 g/dL — ABNORMAL LOW (ref 6.5–8.1)

## 2021-11-20 LAB — AMYLASE, PLEURAL OR PERITONEAL FLUID: Amylase, Fluid: 256 U/L

## 2021-11-20 LAB — BASIC METABOLIC PANEL
Anion gap: 10 (ref 5–15)
BUN: 10 mg/dL (ref 6–20)
CO2: 25 mmol/L (ref 22–32)
Calcium: 8.8 mg/dL — ABNORMAL LOW (ref 8.9–10.3)
Chloride: 100 mmol/L (ref 98–111)
Creatinine, Ser: 0.8 mg/dL (ref 0.61–1.24)
GFR, Estimated: >60 mL/min (ref >60)
Glucose, Bld: 100 mg/dL — ABNORMAL HIGH (ref 70–99)
Potassium: 3.9 mmol/L (ref 3.5–5.1)
Sodium: 135 mmol/L (ref 135–145)

## 2021-11-20 LAB — CBC WITH DIFFERENTIAL/PLATELET
Abs Immature Granulocytes: 0.08 10*3/uL — ABNORMAL HIGH (ref 0.00–0.07)
Basophils Absolute: 0.1 10*3/uL (ref 0.0–0.1)
Basophils Relative: 0 %
Eosinophils Absolute: 0.2 10*3/uL (ref 0.0–0.5)
Eosinophils Relative: 1 %
HCT: 32.5 % — ABNORMAL LOW (ref 39.0–52.0)
Hemoglobin: 10.8 g/dL — ABNORMAL LOW (ref 13.0–17.0)
Immature Granulocytes: 0 %
Lymphocytes Relative: 9 %
Lymphs Abs: 1.7 10*3/uL (ref 0.7–4.0)
MCH: 28.6 pg (ref 26.0–34.0)
MCHC: 33.2 g/dL (ref 30.0–36.0)
MCV: 86.2 fL (ref 80.0–100.0)
Monocytes Absolute: 2.1 10*3/uL — ABNORMAL HIGH (ref 0.1–1.0)
Monocytes Relative: 11 %
Neutro Abs: 14.4 10*3/uL — ABNORMAL HIGH (ref 1.7–7.7)
Neutrophils Relative %: 79 %
Platelets: 1142 10*3/uL (ref 150–400)
RBC: 3.77 MIL/uL — ABNORMAL LOW (ref 4.22–5.81)
RDW: 13.8 % (ref 11.5–15.5)
WBC: 18.5 10*3/uL — ABNORMAL HIGH (ref 4.0–10.5)
nRBC: 0 % (ref 0.0–0.2)

## 2021-11-20 LAB — CBC
HCT: 33.3 % — ABNORMAL LOW (ref 39.0–52.0)
Hemoglobin: 10.9 g/dL — ABNORMAL LOW (ref 13.0–17.0)
MCH: 28.4 pg (ref 26.0–34.0)
MCHC: 32.7 g/dL (ref 30.0–36.0)
MCV: 86.7 fL (ref 80.0–100.0)
Platelets: 1208 10*3/uL (ref 150–400)
RBC: 3.84 MIL/uL — ABNORMAL LOW (ref 4.22–5.81)
RDW: 13.8 % (ref 11.5–15.5)
WBC: 18.1 10*3/uL — ABNORMAL HIGH (ref 4.0–10.5)
nRBC: 0.2 % (ref 0.0–0.2)

## 2021-11-20 LAB — LIPASE, BLOOD: Lipase: 115 U/L — ABNORMAL HIGH (ref 11–51)

## 2021-11-20 LAB — LACTIC ACID, PLASMA: Lactic Acid, Venous: 1.8 mmol/L (ref 0.5–1.9)

## 2021-11-20 MED ORDER — ENOXAPARIN SODIUM 30 MG/0.3ML IJ SOSY
30.0000 mg | PREFILLED_SYRINGE | Freq: Two times a day (BID) | INTRAMUSCULAR | Status: DC
  Administered 2021-11-21 (×2): 30 mg via SUBCUTANEOUS
  Filled 2021-11-20 (×4): qty 0.3

## 2021-11-20 MED ORDER — OXYCODONE HCL 5 MG PO TABS: 5.0000 mg | ORAL_TABLET | ORAL | Status: DC | PRN

## 2021-11-20 MED ORDER — DOCUSATE SODIUM 100 MG PO CAPS
100.0000 mg | ORAL_CAPSULE | Freq: Two times a day (BID) | ORAL | Status: DC
  Administered 2021-11-22: 100 mg via ORAL
  Filled 2021-11-20 (×4): qty 1

## 2021-11-20 MED ORDER — IOHEXOL 350 MG/ML SOLN
80.0000 mL | Freq: Once | INTRAVENOUS | Status: AC | PRN
  Administered 2021-11-20: 80 mL via INTRAVENOUS

## 2021-11-20 MED ORDER — PIPERACILLIN-TAZOBACTAM 3.375 G IVPB
3.3750 g | Freq: Three times a day (TID) | INTRAVENOUS | Status: DC
  Administered 2021-11-20 – 2021-11-23 (×8): 3.375 g via INTRAVENOUS
  Filled 2021-11-20 (×9): qty 50

## 2021-11-20 MED ORDER — LACTATED RINGERS IV SOLN: INTRAVENOUS | Status: DC

## 2021-11-20 MED ORDER — METHOCARBAMOL 1000 MG/10ML IJ SOLN
500.0000 mg | Freq: Four times a day (QID) | INTRAVENOUS | Status: DC | PRN
  Filled 2021-11-20 (×2): qty 5

## 2021-11-20 MED ORDER — MIDAZOLAM HCL 2 MG/2ML IJ SOLN
INTRAMUSCULAR | Status: AC
  Filled 2021-11-20: qty 6

## 2021-11-20 MED ORDER — OXYCODONE HCL 5 MG PO TABS
10.0000 mg | ORAL_TABLET | ORAL | Status: DC | PRN
  Administered 2021-11-20 – 2021-11-22 (×9): 10 mg via ORAL
  Filled 2021-11-20 (×9): qty 2

## 2021-11-20 MED ORDER — METHOCARBAMOL 1000 MG/10ML IJ SOLN
1000.0000 mg | Freq: Three times a day (TID) | INTRAVENOUS | Status: DC
  Administered 2021-11-21: 1000 mg via INTRAVENOUS
  Filled 2021-11-20: qty 10

## 2021-11-20 MED ORDER — PIPERACILLIN-TAZOBACTAM 3.375 G IVPB 30 MIN
3.3750 g | Freq: Once | INTRAVENOUS | Status: AC
  Administered 2021-11-20: 3.375 g via INTRAVENOUS
  Filled 2021-11-20: qty 50

## 2021-11-20 MED ORDER — HYDROMORPHONE HCL 1 MG/ML IJ SOLN: 0.5000 mg | INTRAMUSCULAR | Status: DC | PRN

## 2021-11-20 MED ORDER — FENTANYL CITRATE (PF) 100 MCG/2ML IJ SOLN
INTRAMUSCULAR | Status: AC
  Filled 2021-11-20: qty 4

## 2021-11-20 MED ORDER — FENTANYL CITRATE (PF) 100 MCG/2ML IJ SOLN
INTRAMUSCULAR | Status: AC | PRN
  Administered 2021-11-20 (×2): 25 ug via INTRAVENOUS
  Administered 2021-11-20: 50 ug via INTRAVENOUS

## 2021-11-20 MED ORDER — ACETAMINOPHEN 500 MG PO TABS
1000.0000 mg | ORAL_TABLET | Freq: Four times a day (QID) | ORAL | Status: DC
Start: 2021-11-20 — End: 2021-11-23
  Administered 2021-11-21 – 2021-11-23 (×6): 1000 mg via ORAL
  Filled 2021-11-20 (×7): qty 2

## 2021-11-20 MED ORDER — LIDOCAINE HCL 1 % IJ SOLN
INTRAMUSCULAR | Status: AC
  Filled 2021-11-20: qty 10

## 2021-11-20 MED ORDER — ACETAMINOPHEN 325 MG PO TABS
650.0000 mg | ORAL_TABLET | Freq: Four times a day (QID) | ORAL | Status: DC
  Administered 2021-11-20: 650 mg via ORAL
  Filled 2021-11-20: qty 2

## 2021-11-20 MED ORDER — ONDANSETRON HCL 4 MG/2ML IJ SOLN
4.0000 mg | Freq: Four times a day (QID) | INTRAMUSCULAR | Status: DC | PRN
Start: 2021-11-20 — End: 2021-11-23

## 2021-11-20 MED ORDER — PROCHLORPERAZINE EDISYLATE 10 MG/2ML IJ SOLN: 10.0000 mg | INTRAMUSCULAR | Status: DC | PRN

## 2021-11-20 MED ORDER — DEXTROSE 5 % IV SOLN
1000.0000 mg | Freq: Three times a day (TID) | INTRAVENOUS | Status: DC
  Administered 2021-11-20: 1000 mg via INTRAVENOUS
  Filled 2021-11-20: qty 10

## 2021-11-20 MED ORDER — KETOROLAC TROMETHAMINE 15 MG/ML IJ SOLN
30.0000 mg | Freq: Four times a day (QID) | INTRAMUSCULAR | Status: DC
  Administered 2021-11-20 – 2021-11-23 (×4): 30 mg via INTRAVENOUS
  Filled 2021-11-20 (×8): qty 2

## 2021-11-20 MED ORDER — MIDAZOLAM HCL 2 MG/2ML IJ SOLN
INTRAMUSCULAR | Status: AC | PRN
Start: 2021-11-20 — End: 2021-11-20
  Administered 2021-11-20 (×2): .5 mg via INTRAVENOUS
  Administered 2021-11-20 (×2): 1 mg via INTRAVENOUS

## 2021-11-20 MED ORDER — SIMETHICONE 80 MG PO CHEW: 80.0000 mg | CHEWABLE_TABLET | Freq: Four times a day (QID) | ORAL | Status: DC | PRN
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
## 2021-11-20 NOTE — Progress Notes (Signed)
Patient arrived to 6 north room 32 alert and oriented x4, pain level 5/10, bed in lowest position, call light in reach. Dinner tray ordered. Will continue to monitor patient. ?
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
## 2021-11-20 NOTE — ED Notes (Signed)
Patient is off the floor in IR ?

## 2021-11-20 NOTE — ED Notes (Signed)
Patient transported to CT 

## 2021-11-20 NOTE — ED Notes (Signed)
Pt refusing all blood work

## 2021-11-20 NOTE — Sedation Documentation (Signed)
80cc purulent fluid aspirated from abdominal fluid collection. 350cc blood tinged, hazy yellow fluid aspirated from LEFT pleural space.   ?

## 2021-11-20 NOTE — ED Notes (Signed)
Patient transported to IR 

## 2021-11-20 NOTE — Progress Notes (Signed)
Patient comfortable. Plan for IR this AM. Case d/w Dr. Elby Showers and concerns re: pancreatic leak relayed. Plan for amylase to be sent from abdominal and thoracic fluid collections.  ? ?Diamantina Monks, MD ?General and Trauma Surgery ?[facility] ? ?

## 2021-11-20 NOTE — ED Notes (Addendum)
Patient returns from IR. There was 350 cc of fluid taken from left lung, 80 cc of pus from abdominal cavity. Patient was medicated with 2 of Versed IV ans 100 mcq of Fentanyl IV.  ?

## 2021-11-20 NOTE — Procedures (Signed)
Interventional Radiology Procedure Note ? ?Procedure:  ?1) Ultrasound guided left thoracentesis ?2) CT guided left retroperitoneal drain placement ? ?Findings: Please refer to procedural dictation for full description.   ? ?350 mL translucent, straw-colored fluid removed from left pleural space.  Trace left pneumothorax after thoracentesis. ? ?10.2 Fr left retroperitoneal pigtail drain placed in fluid collection yielding 80 mL purulent aspirate.  Drain placed to bulb suction. ? ?Separate samples from both sites sent for fluid analysis, culture, and amylase. ? ?Complications:  Trace left pneumothorax. ? ?Estimated Blood Loss: < 5 mL ? ?Recommendations: ?Portable CXR in 1 hour. ?Follow up fluid studies. ?Keep drain to bulb suction for now. ?IR will follow. ? ?Ruthann Cancer, MD ?Pager: [phone] ? ? ? ?

## 2021-11-20 NOTE — ED Notes (Signed)
Patient refused labs.nurse is awear. ?
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
## 2021-11-21 DIAGNOSIS — Z90411 Acquired partial absence of pancreas: Secondary | ICD-10-CM | POA: Diagnosis not present

## 2021-11-21 DIAGNOSIS — T8132XA Disruption of internal operation (surgical) wound, not elsewhere classified, initial encounter: Secondary | ICD-10-CM | POA: Diagnosis present

## 2021-11-21 DIAGNOSIS — F17210 Nicotine dependence, cigarettes, uncomplicated: Secondary | ICD-10-CM | POA: Diagnosis present

## 2021-11-21 DIAGNOSIS — Z933 Colostomy status: Secondary | ICD-10-CM | POA: Diagnosis not present

## 2021-11-21 DIAGNOSIS — K8689 Other specified diseases of pancreas: Secondary | ICD-10-CM | POA: Diagnosis present

## 2021-11-21 DIAGNOSIS — Z9081 Acquired absence of spleen: Secondary | ICD-10-CM | POA: Diagnosis not present

## 2021-11-21 DIAGNOSIS — Y838 Other surgical procedures as the cause of abnormal reaction of the patient, or of later complication, without mention of misadventure at the time of the procedure: Secondary | ICD-10-CM | POA: Diagnosis present

## 2021-11-21 DIAGNOSIS — Z79899 Other long term (current) drug therapy: Secondary | ICD-10-CM | POA: Diagnosis not present

## 2021-11-21 DIAGNOSIS — J90 Pleural effusion, not elsewhere classified: Secondary | ICD-10-CM | POA: Diagnosis present

## 2021-11-21 DIAGNOSIS — D72829 Elevated white blood cell count, unspecified: Secondary | ICD-10-CM | POA: Diagnosis present

## 2021-11-21 DIAGNOSIS — J95811 Postprocedural pneumothorax: Secondary | ICD-10-CM | POA: Diagnosis not present

## 2021-11-21 DIAGNOSIS — D75839 Thrombocytosis, unspecified: Secondary | ICD-10-CM | POA: Diagnosis present

## 2021-11-21 LAB — AMYLASE, PLEURAL OR PERITONEAL FLUID: Amylase, Fluid: >2400 U/L

## 2021-11-21 MED ORDER — SODIUM CHLORIDE 0.9% FLUSH
5.0000 mL | Freq: Three times a day (TID) | INTRAVENOUS | Status: DC
  Administered 2021-11-21 – 2021-11-23 (×6): 5 mL

## 2021-11-21 MED ORDER — METHOCARBAMOL 500 MG PO TABS
1000.0000 mg | ORAL_TABLET | Freq: Three times a day (TID) | ORAL | Status: DC
Start: 2021-11-21 — End: 2021-11-23
  Administered 2021-11-21 – 2021-11-23 (×5): 1000 mg via ORAL
  Filled 2021-11-21 (×6): qty 2
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
## 2021-11-21 NOTE — Consult Note (Addendum)
WOC Nurse ostomy consult note ?Patient receiving care in [facility] 6N32 ?4" pouch was placed on patient last night which is too large. This pouch was removed and replaced with 2 1/4" 2 pc pouch. ?Stoma type/location: LMQ transverse colostomy ?Stomal assessment/size: 1 3/4" pink budded ?Peristomal assessment: intact ?Treatment options for stomal/peristomal skin: Barrier ring, no sting barrier film ?Output: thin brown ?Ostomy pouching: 2pc. 2 1/4" pouch [place] # [phone]) Skin barrier Hart [place] # [id number]) ?Education provided: None dozes off during pouch change. Will not participate. ?Enrolled patient in [employer] DC program: Yes (previously) ?Patient also stated to the admitting MD that he didn't get shown how to take care of his ostomy. This nurse made four visits to him during his last admission, most of which he did not want to cooperate and participate in care. He did a complete pouch change for me on the day of discharge and stated "I got this, I been watching you." I reassured him that he could come to the ostomy clinic for assistance and that he could refer to the educational materials that I gave him for help as well once he was at home. ? ?Thank you for the consult. WOC nurse will not follow at this time.   ?Please re-consult the WOC team if needed. ? ?Renaldo Reel. Katrinka Blazing, MSN, RN, CMSRN, AGCNS, WTA ?Wound Treatment Associate ?Pager [phone]    ?

## 2021-11-21 NOTE — Plan of Care (Signed)
?  Problem: Elimination: ?Goal: Will not experience complications related to bowel motility ?Outcome: Progressing ?  ?Problem: Pain Managment: ?Goal: General experience of comfort will improve ?Outcome: Progressing ?  ?Problem: Safety: ?Goal: Ability to remain free from injury will improve ?Outcome: Progressing ?  ?
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
## 2021-11-22 LAB — AEROBIC/ANAEROBIC CULTURE W GRAM STAIN (SURGICAL/DEEP WOUND)
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
## 2021-11-22 NOTE — Plan of Care (Signed)
  Problem: Nutrition: Goal: Adequate nutrition will be maintained Outcome: Progressing   Problem: Elimination: Goal: Will not experience complications related to bowel motility Outcome: Progressing   Problem: Pain Managment: Goal: General experience of comfort will improve Outcome: Progressing   
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
## 2021-11-23 ENCOUNTER — Other Ambulatory Visit: Payer: Self-pay | Admitting: Physician Assistant

## 2021-11-23 DIAGNOSIS — W3400XA Accidental discharge from unspecified firearms or gun, initial encounter: Secondary | ICD-10-CM

## 2021-11-23 LAB — CBC
HCT: 33 % — ABNORMAL LOW (ref 39.0–52.0)
Hemoglobin: 10.7 g/dL — ABNORMAL LOW (ref 13.0–17.0)
MCH: 28.2 pg (ref 26.0–34.0)
MCHC: 32.4 g/dL (ref 30.0–36.0)
MCV: 86.8 fL (ref 80.0–100.0)
Platelets: 1228 10*3/uL (ref 150–400)
RBC: 3.8 MIL/uL — ABNORMAL LOW (ref 4.22–5.81)
RDW: 13.7 % (ref 11.5–15.5)
WBC: 9.3 10*3/uL (ref 4.0–10.5)
nRBC: 0 % (ref 0.0–0.2)

## 2021-11-23 LAB — BASIC METABOLIC PANEL
Anion gap: 8 (ref 5–15)
BUN: 17 mg/dL (ref 6–20)
CO2: 23 mmol/L (ref 22–32)
Calcium: 8.8 mg/dL — ABNORMAL LOW (ref 8.9–10.3)
Chloride: 109 mmol/L (ref 98–111)
Creatinine, Ser: 0.79 mg/dL (ref 0.61–1.24)
GFR, Estimated: >60 mL/min (ref >60)
Glucose, Bld: 83 mg/dL (ref 70–99)
Potassium: 4.7 mmol/L (ref 3.5–5.1)
Sodium: 140 mmol/L (ref 135–145)

## 2021-11-23 MED ORDER — SODIUM CHLORIDE 0.9% FLUSH: 5.0000 mL | Freq: Every day | INTRAVENOUS | 1 refills | Status: DC

## 2021-11-23 MED ORDER — AMOXICILLIN-POT CLAVULANATE 875-125 MG PO TABS: 1.0000 | ORAL_TABLET | Freq: Two times a day (BID) | ORAL | 0 refills | Status: AC

## 2021-11-23 MED ORDER — AMOXICILLIN-POT CLAVULANATE 875-125 MG PO TABS
1.0000 | ORAL_TABLET | Freq: Two times a day (BID) | ORAL | Status: DC
  Administered 2021-11-23: 1 via ORAL
  Filled 2021-11-23: qty 1

## 2021-11-23 MED ORDER — OXYCODONE HCL 5 MG PO TABS: 5.0000 mg | ORAL_TABLET | Freq: Four times a day (QID) | ORAL | 0 refills | Status: DC | PRN
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
## 2021-11-23 NOTE — Plan of Care (Signed)
Will discharge ?

## 2021-11-23 NOTE — Progress Notes (Signed)
Patient was able to demonstrate emptying and flushing of drain in anticipation of discharge plans today. ?
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
## 2021-11-23 NOTE — Progress Notes (Signed)
Taxi voucher requested. CSW spoke with patient. He stated he has no way home and he provided address: [address] [place]. CSW notified TOC Supervisor. Voucher placed on hard chart. ? ?Osborne Casco Rolinda Impson ?LCSW, MSW, MHA ? ?

## 2021-11-23 NOTE — Progress Notes (Signed)
Patient refused all meds, found out that IV was pulled out too and he refused reinserion. Will hold off on reinsertion per PA pending possible discharge today. ?

## 2021-11-23 NOTE — TOC CAGE-AID Note (Signed)
Transition of Care (TOC) - CAGE-AID Screening ? ? ?Patient Details  ?Name: Dalton Moore ?MRN: [id number] ?Date of Birth: 03/16/1996 ? ?Transition of Care (TOC) CM/SW Contact:    ?Vanessa Barbara, RN ?Phone Number: ?11/23/2021, 11:46 AM ? ? ?Clinical Narrative: ?Patient refused to participate in screening. ? ? ?CAGE-AID Screening: ?Substance Abuse Screening unable to be completed due to: : Patient Refused ? ?  ?  ?  ?  ?  ? ?  ? ?  ? ? ? ? ? ? ?

## 2021-11-25 LAB — CULTURE, BLOOD (ROUTINE X 2)
Culture: NO GROWTH
Culture: NO GROWTH
Special Requests: ADEQUATE
Special Requests: ADEQUATE

## 2021-11-27 ENCOUNTER — Other Ambulatory Visit: Payer: Self-pay | Admitting: Surgery

## 2021-11-27 DIAGNOSIS — W3400XA Accidental discharge from unspecified firearms or gun, initial encounter: Secondary | ICD-10-CM

## 2021-12-06 ENCOUNTER — Encounter (HOSPITAL_COMMUNITY)
Admission: RE | Admit: 2021-12-06 | Discharge: 2021-12-06 | Disposition: A | Discharge status: Home / Self Care | Payer: Medicaid Other | Source: Ambulatory Visit | Attending: Nurse Practitioner | Admitting: Nurse Practitioner

## 2021-12-06 DIAGNOSIS — Y833 Surgical operation with formation of external stoma as the cause of abnormal reaction of the patient, or of later complication, without mention of misadventure at the time of the procedure: Secondary | ICD-10-CM | POA: Diagnosis not present

## 2021-12-06 DIAGNOSIS — Z433 Encounter for attention to colostomy: Secondary | ICD-10-CM | POA: Diagnosis not present

## 2021-12-06 DIAGNOSIS — T8189XA Other complications of procedures, not elsewhere classified, initial encounter: Secondary | ICD-10-CM | POA: Insufficient documentation

## 2021-12-06 DIAGNOSIS — L258 Unspecified contact dermatitis due to other agents: Secondary | ICD-10-CM | POA: Diagnosis not present

## 2021-12-06 DIAGNOSIS — Z933 Colostomy status: Secondary | ICD-10-CM | POA: Insufficient documentation

## 2021-12-06 DIAGNOSIS — L24B3 Irritant contact dermatitis related to fecal or urinary stoma or fistula: Secondary | ICD-10-CM
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
## 2021-12-09 NOTE — Discharge Instructions (Signed)
GEt set up with patient assistance  [phone] ?Supplies provided for ostomy and wound ?Follow up with CCS and IR at [facility] radiology ?

## 2021-12-11 ENCOUNTER — Ambulatory Visit
Admission: RE | Admit: 2021-12-11 | Discharge: 2021-12-11 | Disposition: A | Discharge status: Home / Self Care | Payer: Self-pay | Source: Ambulatory Visit | Attending: Physician Assistant | Admitting: Physician Assistant

## 2021-12-11 ENCOUNTER — Encounter: Payer: Self-pay | Admitting: *Deleted

## 2021-12-11 ENCOUNTER — Ambulatory Visit
Admission: RE | Admit: 2021-12-11 | Discharge: 2021-12-11 | Disposition: A | Discharge status: Home / Self Care | Payer: Self-pay | Source: Ambulatory Visit | Attending: Surgery | Admitting: Surgery

## 2021-12-11 DIAGNOSIS — W3400XA Accidental discharge from unspecified firearms or gun, initial encounter: Secondary | ICD-10-CM

## 2021-12-11 DIAGNOSIS — Y249XXA Unspecified firearm discharge, undetermined intent, initial encounter: Secondary | ICD-10-CM

## 2021-12-11 HISTORY — PX: IR RADIOLOGIST EVAL & MGMT: IMG5224

## 2021-12-11 MED ORDER — IOPAMIDOL (ISOVUE-300) INJECTION 61%
100.0000 mL | Freq: Once | INTRAVENOUS | Status: AC | PRN
  Administered 2021-12-11: 100 mL via INTRAVENOUS
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
## 2021-12-16 ENCOUNTER — Ambulatory Visit (HOSPITAL_COMMUNITY): Payer: Medicaid Other | Admitting: Nurse Practitioner

## 2021-12-16 ENCOUNTER — Ambulatory Visit (HOSPITAL_COMMUNITY): Payer: Self-pay | Admitting: Nurse Practitioner

## 2021-12-31 ENCOUNTER — Encounter (HOSPITAL_COMMUNITY): Payer: Self-pay | Admitting: Nurse Practitioner

## 2022-01-31 ENCOUNTER — Encounter (HOSPITAL_COMMUNITY): Payer: Self-pay | Admitting: Nurse Practitioner

## 2022-02-07 ENCOUNTER — Encounter (HOSPITAL_COMMUNITY): Payer: Self-pay | Admitting: Nurse Practitioner

## 2022-02-18 ENCOUNTER — Encounter (HOSPITAL_COMMUNITY): Payer: Self-pay | Admitting: Nurse Practitioner

## 2022-03-11 ENCOUNTER — Emergency Department (HOSPITAL_COMMUNITY)
Admission: EM | Admit: 2022-03-11 | Discharge: 2022-03-14 | Disposition: A | Discharge status: Home / Self Care | Payer: Medicaid Other | Attending: Emergency Medicine | Admitting: Emergency Medicine

## 2022-03-11 ENCOUNTER — Encounter (HOSPITAL_COMMUNITY): Payer: Self-pay | Admitting: Pharmacy Technician

## 2022-03-11 DIAGNOSIS — Z20822 Contact with and (suspected) exposure to covid-19: Secondary | ICD-10-CM | POA: Insufficient documentation

## 2022-03-11 DIAGNOSIS — F4310 Post-traumatic stress disorder, unspecified: Secondary | ICD-10-CM

## 2022-03-11 DIAGNOSIS — F1910 Other psychoactive substance abuse, uncomplicated: Secondary | ICD-10-CM | POA: Insufficient documentation

## 2022-03-11 DIAGNOSIS — R1084 Generalized abdominal pain: Secondary | ICD-10-CM | POA: Insufficient documentation

## 2022-03-11 DIAGNOSIS — R462 Strange and inexplicable behavior: Secondary | ICD-10-CM

## 2022-03-11 DIAGNOSIS — F29 Unspecified psychosis not due to a substance or known physiological condition: Secondary | ICD-10-CM | POA: Insufficient documentation

## 2022-03-11 DIAGNOSIS — R456 Violent behavior: Secondary | ICD-10-CM | POA: Diagnosis present

## 2022-03-11 LAB — COMPREHENSIVE METABOLIC PANEL
ALT: 52 U/L — ABNORMAL HIGH (ref 0–44)
AST: 28 U/L (ref 15–41)
Albumin: 4.1 g/dL (ref 3.5–5.0)
Alkaline Phosphatase: 121 U/L (ref 38–126)
Anion gap: 9 (ref 5–15)
BUN: 15 mg/dL (ref 6–20)
CO2: 25 mmol/L (ref 22–32)
Calcium: 9.4 mg/dL (ref 8.9–10.3)
Chloride: 107 mmol/L (ref 98–111)
Creatinine, Ser: 0.86 mg/dL (ref 0.61–1.24)
GFR, Estimated: >60 mL/min (ref >60)
Glucose, Bld: 119 mg/dL — ABNORMAL HIGH (ref 70–99)
Potassium: 4.2 mmol/L (ref 3.5–5.1)
Sodium: 141 mmol/L (ref 135–145)
Total Bilirubin: 0.2 mg/dL — ABNORMAL LOW (ref 0.3–1.2)
Total Protein: 6.7 g/dL (ref 6.5–8.1)

## 2022-03-11 LAB — CBC WITH DIFFERENTIAL/PLATELET
Abs Immature Granulocytes: 0.04 10*3/uL (ref 0.00–0.07)
Basophils Absolute: 0.1 10*3/uL (ref 0.0–0.1)
Basophils Relative: 1 %
Eosinophils Absolute: 0 10*3/uL (ref 0.0–0.5)
Eosinophils Relative: 0 %
HCT: 43.2 % (ref 39.0–52.0)
Hemoglobin: 14.4 g/dL (ref 13.0–17.0)
Immature Granulocytes: 0 %
Lymphocytes Relative: 12 %
Lymphs Abs: 1.2 10*3/uL (ref 0.7–4.0)
MCH: 28 pg (ref 26.0–34.0)
MCHC: 33.3 g/dL (ref 30.0–36.0)
MCV: 83.9 fL (ref 80.0–100.0)
Monocytes Absolute: 0.7 10*3/uL (ref 0.1–1.0)
Monocytes Relative: 6 %
Neutro Abs: 8.5 10*3/uL — ABNORMAL HIGH (ref 1.7–7.7)
Neutrophils Relative %: 81 %
Platelets: 442 10*3/uL — ABNORMAL HIGH (ref 150–400)
RBC: 5.15 MIL/uL (ref 4.22–5.81)
RDW: 15.8 % — ABNORMAL HIGH (ref 11.5–15.5)
WBC: 10.5 10*3/uL (ref 4.0–10.5)
nRBC: 0 % (ref 0.0–0.2)

## 2022-03-11 LAB — LIPASE, BLOOD: Lipase: 59 U/L — ABNORMAL HIGH (ref 11–51)

## 2022-03-11 NOTE — ED Triage Notes (Addendum)
Pt here with reports of PTSD. States he has been off his medication for several months and wants to get back on them. Pt denies SI/HI. Pt tender when palpating abd.
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
## 2022-03-12 ENCOUNTER — Emergency Department (HOSPITAL_COMMUNITY): Payer: Medicaid Other

## 2022-03-12 ENCOUNTER — Encounter (HOSPITAL_COMMUNITY): Payer: Self-pay | Admitting: Student

## 2022-03-12 MED ORDER — BUPROPION HCL ER (XL) 150 MG PO TB24
150.0000 mg | ORAL_TABLET | Freq: Every day | ORAL | Status: DC
  Administered 2022-03-12 – 2022-03-13 (×2): 150 mg via ORAL
  Filled 2022-03-12 (×3): qty 1

## 2022-03-12 MED ORDER — LORAZEPAM 1 MG PO TABS
2.0000 mg | ORAL_TABLET | Freq: Once | ORAL | Status: AC
  Administered 2022-03-12: 2 mg via ORAL
  Filled 2022-03-12: qty 2

## 2022-03-12 MED ORDER — IOHEXOL 300 MG/ML  SOLN
100.0000 mL | Freq: Once | INTRAMUSCULAR | Status: AC | PRN
  Administered 2022-03-12: 100 mL via INTRAVENOUS

## 2022-03-12 MED ORDER — LORAZEPAM 1 MG PO TABS
1.0000 mg | ORAL_TABLET | Freq: Three times a day (TID) | ORAL | Status: DC | PRN
  Administered 2022-03-12 – 2022-03-13 (×2): 1 mg via ORAL
  Filled 2022-03-12 (×2): qty 1

## 2022-03-12 MED ORDER — OLANZAPINE 5 MG PO TBDP
5.0000 mg | ORAL_TABLET | Freq: Every day | ORAL | Status: DC
Start: 2022-03-12 — End: 2022-03-13
  Administered 2022-03-12: 5 mg via ORAL
  Filled 2022-03-12: qty 1

## 2022-03-12 NOTE — Consult Note (Signed)
WOC Nurse ostomy consult note Patient receiving care in [facility] [id number] Upon entering the room patient was dressed in paper scrubs saturated with stool. Patient had no pouch on. Patient is independent with ostomy care. Ostomy created in April  Stoma type/location: LMQ transverse colostomy Stomal assessment/size: 1 3/4 inch pink budded Peristomal assessment: intact Treatment options for stomal/peristomal skin: Barrier ring Output: mushy brown Ostomy pouching: 2pc. 2 1/4 inch pouch [place] # [phone]) Skin barrier [place] # [phone]) Barrier rings [place] # [phone]) Education provided: None Enrolled patient in [place] Secure Start DC program: Yes previously  WOC will not follow at this time. Please re-consult if any further needs.  Renaldo Reel Katrinka Blazing, MSN, RN, CMSRN, Angus Seller, [facility] Wound Treatment Associate Pager [phone]

## 2022-03-12 NOTE — ED Notes (Signed)
Pt recently pleasant/ cooperative. Colostomy addressed by WOC RN. EDP updated.

## 2022-03-12 NOTE — ED Notes (Signed)
Finished evening meal, given another happy meal and PB "very hungry".
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
## 2022-03-12 NOTE — ED Notes (Signed)
Sleeping, NAD, calm. Mother called to check on son. Updated.
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
## 2022-03-12 NOTE — ED Notes (Signed)
Pt mother called in hopes to have patient consulted for mental health, pt also has issues with colostomy bag, ordering one for patient.

## 2022-03-12 NOTE — ED Notes (Addendum)
Pt sleeping, NAD, calm.  

## 2022-03-12 NOTE — ED Notes (Addendum)
Pt informed that he would be admitted to inpt psych. Agreeable and appreciative. Asking if it would be 1-2 weeks. Explained that expectation was likely less. Remains agreeable. Mentions abd pain, described as tightening. Asked what he usually takes, he states Roxy works well. No s/sx of pain except verbal complaint, and unremarkable colostomy (now with complete kit/ bag intact). Declines nicotine patch at this time.
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
## 2022-03-12 NOTE — BH Assessment (Signed)
03/12/2022 @ 2111, received a phone call from "Kia" with [facility]. She states that patient has been accepted to their facility by Dr. Sallyanne Kuster. Nurse report [phone]. Patient's bed will be ready 03/13/2022 @ 8:00 am.  Patient's nurse Armond Hang, RN) was updated of patient's disposition.

## 2022-03-12 NOTE — ED Notes (Signed)
Reclining in bed, remains labile, jovial and irritable, calm and agitated, accepting and suspicious, declined food, requested remote/ given, accepted ativan and wellbutrin with water, verbalizes dislike/ distrust of previous psych meds (haldol, risperdal), and avoiding being "controlled". Meds made me sleep all the time. Lights dimmed per request.

## 2022-03-12 NOTE — ED Notes (Signed)
PATIENT REQUEST FOR IV TO BE REMOVED .HAS BEEN REMOVED

## 2022-03-12 NOTE — ED Notes (Signed)
TTS complete, sandwich eaten, requesting another, evening dinner meal arriving at this time. Pt polite and apologetic, offering to pay for his sandwich.

## 2022-03-12 NOTE — ED Notes (Signed)
WOC finished at [facility]. Pt now polite, cooperative, pleasant, requesting food and lights out, granted/given. Med rec tech into see.

## 2022-03-12 NOTE — ED Notes (Signed)
WOC RN at [employer]

## 2022-03-12 NOTE — ED Notes (Signed)
EDP into see, pt sleeping, will let sleep, will notify MD when awake.
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
## 2022-03-12 NOTE — ED Notes (Addendum)
Given happy meal/ sandwich, BP, crackers, apple sauce, OJ and milk. TTS initiated, in progress. Pt alert, NAD, calm, interactive, cooperative, participatory.

## 2022-03-12 NOTE — ED Notes (Signed)
Resting, snack/meal eaten, watching TV, calm, NAD.

## 2022-03-12 NOTE — ED Notes (Signed)
PATIENT WENT OUTSIDE TO SMOKE
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
## 2022-03-13 DIAGNOSIS — F4310 Post-traumatic stress disorder, unspecified: Secondary | ICD-10-CM

## 2022-03-13 DIAGNOSIS — R462 Strange and inexplicable behavior: Secondary | ICD-10-CM

## 2022-03-13 LAB — URINALYSIS, ROUTINE W REFLEX MICROSCOPIC
Bilirubin Urine: NEGATIVE
Glucose, UA: NEGATIVE mg/dL
Hgb urine dipstick: NEGATIVE
Ketones, ur: NEGATIVE mg/dL
Leukocytes,Ua: NEGATIVE
Nitrite: NEGATIVE
Protein, ur: NEGATIVE mg/dL
Specific Gravity, Urine: 1.024 (ref 1.005–1.030)
pH: 6 (ref 5.0–8.0)

## 2022-03-13 LAB — RESP PANEL BY RT-PCR (FLU A&B, COVID) ARPGX2
Influenza A by PCR: NEGATIVE
Influenza B by PCR: NEGATIVE
SARS Coronavirus 2 by RT PCR: NEGATIVE

## 2022-03-13 MED ORDER — OLANZAPINE 5 MG PO TBDP
7.5000 mg | ORAL_TABLET | Freq: Every day | ORAL | Status: DC
Start: 2022-03-13 — End: 2022-03-14
  Administered 2022-03-13: 7.5 mg via ORAL
  Filled 2022-03-13: qty 2

## 2022-03-13 NOTE — ED Notes (Signed)
Pt updated on plan of care for inpt treatment. Pt receptive to information given.

## 2022-03-13 NOTE — BH Assessment (Addendum)
Received an update from [facility] at 12 am that patient's bed offer was rescinded due to him having an ostomy bag. Patient continues to meet inpatient treatment and has been referred to other facilities for consideration of inpt treatment. Updates provided to patient's nurse Armond Hang, RN).
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
## 2022-03-13 NOTE — ED Provider Notes (Signed)
Emergency Medicine Observation Re-evaluation Note  Dalton Moore is a 26 y.o. male, seen on rounds today.  Pt initially presented to the ED for complaints of Post-Traumatic Stress Disorder and Abdominal Pain Currently, the patient is resting.  Physical Exam  BP 132/87   Pulse 86   Temp 97.7 F (36.5 C)   Resp 18   SpO2 100%  Physical Exam General: No acute distress Cardiac: Regular rate Lungs: Breathing easily Psych: Calm mood  ED Course / MDM  EKG:   I have reviewed the labs performed to date as well as medications administered while in observation.  Recent changes in the last 24 hours include no acute events.  Plan  Current plan is for patient treatment.  Dalton Moore is not under involuntary commitment.     Dalton Dibbles, MD 03/13/22 [phone]

## 2022-03-14 DIAGNOSIS — F4310 Post-traumatic stress disorder, unspecified: Secondary | ICD-10-CM

## 2022-03-14 NOTE — Discharge Instructions (Signed)
For your behavioral health needs you are advised to follow up with an outpatient psychiatrist.  Contact one of the providers listed below at your earliest opportunity:       [facility]      [address], [place] [place], [place] [address]      [phone]      They offer psychiatry/medication management and therapy.  New patients are seen in their walk-in clinic.  Walk-in hours are Monday, Wednesday, Thursday and Friday from 8:00 am - 11:00 am for psychiatry, and Monday and Wednesday from 8:00 am - 11:00 am for therapy.  Walk-in patients are seen on a first come, first served basis, so try to arrive as early as possible for the best chance of being seen the same day.  BE SURE TO TAKE THE ELEVATOR TO THE SECOND FLOOR.  Please note that to be eligible for services you must bring an ID or a piece of mail with your name and a [facility] address.       [facility]      [address]      [place], [place] [address]      [phone]

## 2022-03-14 NOTE — Progress Notes (Signed)
Inpatient Behavioral Health Placement  Pt meets inpatient criteria per Hillery Jacks, NP.  There are no available beds at [facility] per [facility] Fransico Michael, RN. Referral was sent to the following facilities;   Destination Service Provider Address Phone Fax  [facility]  [address] [place] [address] [phone]  [facility]  [address] [place], [place] [address] [phone]  [facility] [address] [place]  [address], [place] [address] [phone]  [facility]  [address] [place], [place] [address] [phone]  [facility]  [address] [place] [address] [phone]  [facility]  [address] [place], [place] [address] [phone]  [facility]  [address], [place] [address] [phone]  [facility]  [address] [place] [address] [phone]  [facility]  [address] [place] [address] [phone]  [facility]  [address] [place] [address] [phone]  [facility]  [address], [place] [address] [phone]  [facility]  [address] [place] [address] [phone]  [facility]  [address], [place] [address] [phone]  [facility]  [address] [place] [address] [phone]  [facility]  [address], [place] [address] [phone]  [facility]  [address], [place] [address] [phone]  [facility]  [address],  [place] [address] [phone]  [facility]  [address] [place], [place] [address] [phone]  [facility]  [address], [place] [address] [phone]  [facility]  [address], [place] [address] [phone]  [facility]  [address] [place] [address] [phone]  [facility]  [address] [place] [address] [phone]  [facility]  [address] [place] [address] [phone]  [facility]  [address] [place], [place] [address] [phone]  [facility]  [address] [place] [address] [phone]  [facility]  [address] [place], [place] [address] [phone]  [facility]  [address] [place] [address] [phone]    Situation ongoing,  CSW will follow up.   Maryjean Ka, MSW, LCSWA 03/14/2022  @ 2:09 AM

## 2022-03-14 NOTE — ED Notes (Signed)
Pt provided with peanut butter and crackers per request.

## 2022-03-14 NOTE — ED Notes (Signed)
Attempted to administer scheduled medication at this time. Pt requesting to "take it in a little bit," stating that his stomach hurts right now.

## 2022-03-14 NOTE — ED Provider Notes (Addendum)
Emergency Medicine Observation Re-evaluation Note  Dalton Moore is a 26 y.o. male, seen on rounds today.  Pt initially presented to the ED for complaints of Post-Traumatic Stress Disorder and Abdominal Pain Currently, the patient is resting comfortably in bed.  Patient ate breakfast with no difficulty this morning.  Patient has been medically cleared. Patient has been evaluated by [facility] counselor and recommended inpatient psychiatric treatment.  Physical Exam  BP 133/83 (BP Location: Right Arm)   Pulse 66   Temp 98.3 F (36.8 C) (Oral)   Resp 16   SpO2 99%  Physical Exam General: Appears to be resting comfortably in bed, no acute distress. Cardiac: Regular rate, normal heart rate, non-emergent blood pressure for this morning's vitals. Lungs: No increased work of breathing.  Equal chest rise appreciated Psych: Calm, asleep in bed.   ED Course / MDM  EKG:   I have reviewed the labs performed to date as well as medications administered while in observation.  Recent changes in the last 24 hours include NA.  Plan  Current plan is for placement at outside facility.  Unfortunately, placement arranged yesterday failed secondary to patient's medical problems.  Dalton Moore is not under involuntary commitment.    Update: Patient was reevaluated by psychiatry this a.m.  After rediscussing his presentation they feel that he is stable for outpatient care and management and feel that he does not warrant inpatient care. Patient well-appearing at time of discharge ambulatory stable for continued outpatient care and management.   Glyn Ade, MD 03/14/22 5956    Glyn Ade, MD 03/14/22 1053

## 2022-03-14 NOTE — BH Assessment (Signed)
[facility] Assessment Progress Note   Per Hillery Jacks, NP, this pt does not require psychiatric hospitalization at this time.  Pt is psychiatrically cleared.  Discharge instructions include referral information for [facility] and for [facility].  EDP Glyn Ade, MD and pt's nurse, [place], have been notified.  Doylene Canning, MA Triage Specialist [phone]
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
## 2022-03-14 NOTE — ED Notes (Signed)
AVS with prescriptions provided to and discussed with patient. Pt verbalizes understanding of discharge instructions and denies any questions or concerns at this time. Pt ambulated out of department independently with steady gait. ? ?

## 2022-03-15 ENCOUNTER — Emergency Department (EMERGENCY_DEPARTMENT_HOSPITAL)
Admission: EM | Admit: 2022-03-15 | Discharge: 2022-03-16 | Disposition: A | Discharge status: Home / Self Care | Payer: Medicaid Other | Source: Home / Self Care | Attending: Emergency Medicine | Admitting: Emergency Medicine

## 2022-03-15 ENCOUNTER — Other Ambulatory Visit: Payer: Self-pay

## 2022-03-15 ENCOUNTER — Emergency Department (HOSPITAL_COMMUNITY)
Admission: EM | Admit: 2022-03-15 | Discharge: 2022-03-15 | Disposition: A | Discharge status: Home / Self Care | Payer: Medicaid Other | Attending: Emergency Medicine | Admitting: Emergency Medicine

## 2022-03-15 ENCOUNTER — Encounter (HOSPITAL_COMMUNITY): Payer: Self-pay | Admitting: Emergency Medicine

## 2022-03-15 DIAGNOSIS — L850 Acquired ichthyosis: Secondary | ICD-10-CM | POA: Insufficient documentation

## 2022-03-15 DIAGNOSIS — R45851 Suicidal ideations: Secondary | ICD-10-CM | POA: Insufficient documentation

## 2022-03-15 DIAGNOSIS — Z5900 Homelessness unspecified: Secondary | ICD-10-CM | POA: Insufficient documentation

## 2022-03-15 DIAGNOSIS — R1084 Generalized abdominal pain: Secondary | ICD-10-CM | POA: Diagnosis not present

## 2022-03-15 DIAGNOSIS — D72829 Elevated white blood cell count, unspecified: Secondary | ICD-10-CM | POA: Insufficient documentation

## 2022-03-15 DIAGNOSIS — R109 Unspecified abdominal pain: Secondary | ICD-10-CM | POA: Diagnosis present

## 2022-03-15 DIAGNOSIS — M79671 Pain in right foot: Secondary | ICD-10-CM

## 2022-03-15 DIAGNOSIS — L84 Corns and callosities: Secondary | ICD-10-CM | POA: Insufficient documentation

## 2022-03-15 LAB — COMPREHENSIVE METABOLIC PANEL
ALT: 215 U/L — ABNORMAL HIGH (ref 0–44)
AST: 88 U/L — ABNORMAL HIGH (ref 15–41)
Albumin: 4.2 g/dL (ref 3.5–5.0)
Alkaline Phosphatase: 107 U/L (ref 38–126)
Anion gap: 7 (ref 5–15)
BUN: 11 mg/dL (ref 6–20)
CO2: 28 mmol/L (ref 22–32)
Calcium: 9.7 mg/dL (ref 8.9–10.3)
Chloride: 107 mmol/L (ref 98–111)
Creatinine, Ser: 0.92 mg/dL (ref 0.61–1.24)
GFR, Estimated: >60 mL/min (ref >60)
Glucose, Bld: 87 mg/dL (ref 70–99)
Potassium: 4 mmol/L (ref 3.5–5.1)
Sodium: 142 mmol/L (ref 135–145)
Total Bilirubin: 0.4 mg/dL (ref 0.3–1.2)
Total Protein: 7 g/dL (ref 6.5–8.1)

## 2022-03-15 LAB — CBC
HCT: 40.4 % (ref 39.0–52.0)
Hemoglobin: 13.4 g/dL (ref 13.0–17.0)
MCH: 27.7 pg (ref 26.0–34.0)
MCHC: 33.2 g/dL (ref 30.0–36.0)
MCV: 83.6 fL (ref 80.0–100.0)
Platelets: 393 10*3/uL (ref 150–400)
RBC: 4.83 MIL/uL (ref 4.22–5.81)
RDW: 16.6 % — ABNORMAL HIGH (ref 11.5–15.5)
WBC: 14.7 10*3/uL — ABNORMAL HIGH (ref 4.0–10.5)
nRBC: 0 % (ref 0.0–0.2)

## 2022-03-15 LAB — URINALYSIS, ROUTINE W REFLEX MICROSCOPIC
Bilirubin Urine: NEGATIVE
Glucose, UA: NEGATIVE mg/dL
Hgb urine dipstick: NEGATIVE
Ketones, ur: NEGATIVE mg/dL
Leukocytes,Ua: NEGATIVE
Nitrite: NEGATIVE
Protein, ur: NEGATIVE mg/dL
Specific Gravity, Urine: 1.025 (ref 1.005–1.030)
pH: 6 (ref 5.0–8.0)

## 2022-03-15 LAB — LIPASE, BLOOD: Lipase: 43 U/L (ref 11–51)

## 2022-03-15 MED ORDER — KETOROLAC TROMETHAMINE 15 MG/ML IJ SOLN
15.0000 mg | Freq: Once | INTRAMUSCULAR | Status: AC
  Administered 2022-03-15: 15 mg via INTRAMUSCULAR
  Filled 2022-03-15: qty 1

## 2022-03-15 NOTE — Discharge Instructions (Signed)
Return for any problem.  Follow-up closely in the outpatient setting with your psychiatric providers for continued psychiatric treatment and medication.
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
## 2022-03-15 NOTE — ED Triage Notes (Signed)
Patient reports bilateral feet pain onset this morning , denies injury / ambulatory .

## 2022-03-16 DIAGNOSIS — R45851 Suicidal ideations: Secondary | ICD-10-CM

## 2022-03-16 LAB — CBC WITH DIFFERENTIAL/PLATELET
Abs Immature Granulocytes: 0.17 10*3/uL — ABNORMAL HIGH (ref 0.00–0.07)
Basophils Absolute: 0.1 10*3/uL (ref 0.0–0.1)
Basophils Relative: 1 %
Eosinophils Absolute: 0.8 10*3/uL — ABNORMAL HIGH (ref 0.0–0.5)
Eosinophils Relative: 7 %
HCT: 38.1 % — ABNORMAL LOW (ref 39.0–52.0)
Hemoglobin: 12.5 g/dL — ABNORMAL LOW (ref 13.0–17.0)
Immature Granulocytes: 1 %
Lymphocytes Relative: 27 %
Lymphs Abs: 3.2 10*3/uL (ref 0.7–4.0)
MCH: 28.3 pg (ref 26.0–34.0)
MCHC: 32.8 g/dL (ref 30.0–36.0)
MCV: 86.2 fL (ref 80.0–100.0)
Monocytes Absolute: 1.2 10*3/uL — ABNORMAL HIGH (ref 0.1–1.0)
Monocytes Relative: 10 %
Neutro Abs: 6.4 10*3/uL (ref 1.7–7.7)
Neutrophils Relative %: 54 %
Platelets: 368 10*3/uL (ref 150–400)
RBC: 4.42 MIL/uL (ref 4.22–5.81)
RDW: 16.7 % — ABNORMAL HIGH (ref 11.5–15.5)
WBC: 12 10*3/uL — ABNORMAL HIGH (ref 4.0–10.5)
nRBC: 0 % (ref 0.0–0.2)

## 2022-03-16 LAB — ACETAMINOPHEN LEVEL: Acetaminophen (Tylenol), Serum: <10 ug/mL — ABNORMAL LOW (ref 10–30)

## 2022-03-16 LAB — COMPREHENSIVE METABOLIC PANEL
ALT: 147 U/L — ABNORMAL HIGH (ref 0–44)
AST: 52 U/L — ABNORMAL HIGH (ref 15–41)
Albumin: 3.4 g/dL — ABNORMAL LOW (ref 3.5–5.0)
Alkaline Phosphatase: 111 U/L (ref 38–126)
Anion gap: 7 (ref 5–15)
BUN: 9 mg/dL (ref 6–20)
CO2: 24 mmol/L (ref 22–32)
Calcium: 8.9 mg/dL (ref 8.9–10.3)
Chloride: 105 mmol/L (ref 98–111)
Creatinine, Ser: 0.9 mg/dL (ref 0.61–1.24)
GFR, Estimated: >60 mL/min (ref >60)
Glucose, Bld: 108 mg/dL — ABNORMAL HIGH (ref 70–99)
Potassium: 3.6 mmol/L (ref 3.5–5.1)
Sodium: 136 mmol/L (ref 135–145)
Total Bilirubin: 0.4 mg/dL (ref 0.3–1.2)
Total Protein: 5.7 g/dL — ABNORMAL LOW (ref 6.5–8.1)

## 2022-03-16 LAB — ETHANOL: Alcohol, Ethyl (B): <10 mg/dL (ref <10)

## 2022-03-16 LAB — SALICYLATE LEVEL: Salicylate Lvl: <7 mg/dL — ABNORMAL LOW (ref 7.0–30.0)

## 2022-03-16 MED ORDER — ACETAMINOPHEN 500 MG PO TABS
1000.0000 mg | ORAL_TABLET | Freq: Once | ORAL | Status: AC
  Administered 2022-03-16: 1000 mg via ORAL
  Filled 2022-03-16: qty 2

## 2022-03-16 MED ORDER — BUPROPION HCL ER (XL) 150 MG PO TB24
150.0000 mg | ORAL_TABLET | Freq: Every day | ORAL | Status: DC
Start: 2022-03-16 — End: 2022-03-16
  Filled 2022-03-16: qty 1
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
## 2022-03-16 NOTE — ED Notes (Signed)
PATIENT IS IN LOBBY SLEEPING

## 2022-03-16 NOTE — ED Notes (Signed)
Pt suicidal reassessment d/t pt telling PA he was SI without plan. When this RN asked the pt if he was having suicidal thoughts he states "no but yeah you can say that".

## 2022-03-16 NOTE — ED Notes (Signed)
Pt refused wellbutrin stating he just wanted pain meds. And pt also refused d/c paperwork stating "nah yall left that shit there not me."

## 2022-03-16 NOTE — Discharge Instructions (Addendum)
You came to the emergency department today to be evaluated for your suicidal thoughts and foot pain.  Your physical exam was reassuring; please take Tylenol as indicated below to help with your foot pain.  Please follow-up with the [facility] for continued foot pain.  You spoke to our psychiatric team about your suicidal ideations and were cleared at this time.  Please follow-up with resources in the outpatient setting for further management of your psychiatric care.  Attached are resources for shelters in the area.  Get help right away if: Your foot is numb or tingling. Your foot or toes are swollen. Your foot or toes turn white or blue. You have warmth and redness along your foot. You are talking about suicide or wishing to die. You start making plans for how to commit suicide. You feel that you have no reason to live. You start making plans for putting your affairs in order, saying goodbye, or giving your possessions away. You feel guilt, shame, or unbearable pain, and it seems like there is no way out. You are engaging in risky behaviors that could lead to death.
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
## 2022-03-18 ENCOUNTER — Emergency Department (HOSPITAL_COMMUNITY)
Admission: EM | Admit: 2022-03-18 | Discharge: 2022-03-18 | Discharge status: Against Medical Advice | Payer: Medicaid Other | Source: Home / Self Care

## 2022-03-18 ENCOUNTER — Emergency Department (HOSPITAL_COMMUNITY)
Admission: EM | Admit: 2022-03-18 | Discharge: 2022-03-18 | Disposition: A | Discharge status: Home / Self Care | Payer: Medicaid Other | Attending: Emergency Medicine | Admitting: Emergency Medicine

## 2022-03-18 ENCOUNTER — Encounter (HOSPITAL_COMMUNITY): Payer: Self-pay

## 2022-03-18 ENCOUNTER — Other Ambulatory Visit: Payer: Self-pay

## 2022-03-18 DIAGNOSIS — Z433 Encounter for attention to colostomy: Secondary | ICD-10-CM | POA: Diagnosis present

## 2022-03-18 DIAGNOSIS — Z5321 Procedure and treatment not carried out due to patient leaving prior to being seen by health care provider: Secondary | ICD-10-CM | POA: Insufficient documentation

## 2022-03-18 DIAGNOSIS — R1032 Left lower quadrant pain: Secondary | ICD-10-CM | POA: Insufficient documentation

## 2022-03-18 NOTE — ED Triage Notes (Signed)
Colostomy bag given to patient , left after receiving the bag .
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
## 2022-03-18 NOTE — ED Notes (Signed)
Pt refused blood work in triage.  

## 2022-03-18 NOTE — ED Provider Notes (Signed)
[facility] EMERGENCY DEPARTMENT Provider Note   CSN: [id number] Arrival date & time: 03/18/22  0226     History  Chief Complaint  Patient presents with   Requesting Colostomy Bag    Dalton Moore is a 26 y.o. male.  The history is provided by the patient and medical records.   26 year old male presenting to the ED requesting colostomy supplies.  Does not currently have colostomy bag in place, stool is freely leaking onto his abdomen and clothing.  He denies any other concerns.  Home Medications Prior to Admission medications   Medication Sig Start Date End Date Taking? Authorizing Provider  buPROPion (WELLBUTRIN XL) 150 MG 24 hr tablet Take 1 tablet (150 mg total) by mouth daily. Patient not taking: Reported on 11/20/2021 11/15/21   Juliet Rude, PA-C  ibuprofen (ADVIL) 200 MG tablet Take 600 mg by mouth daily as needed for mild pain or moderate pain.    [provider]  oxyCODONE (OXY IR/ROXICODONE) 5 MG immediate release tablet Take 1 tablet (5 mg total) by mouth every 6 (six) hours as needed for severe pain. Patient not taking: Reported on 03/12/2022 11/23/21   Carlena Bjornstad A, PA-C  sodium chloride flush (NS) 0.9 % SOLN 5 mLs by Intracatheter route daily. Patient not taking: Reported on 03/12/2022 11/23/21   Franne Forts, PA-C      Allergies    Patient has no known allergies.    Review of Systems   Review of Systems  Constitutional:        Colostomy supplies  All other systems reviewed and are negative.   Physical Exam Updated Vital Signs  Refused vitals  Physical Exam Vitals and nursing note reviewed.  Constitutional:      Appearance: He is well-developed.  HENT:     Head: Normocephalic and atraumatic.  Eyes:     Conjunctiva/sclera: Conjunctivae normal.     Pupils: Pupils are equal, round, and reactive to light.  Cardiovascular:     Rate and Rhythm: Normal rate and regular rhythm.     Heart sounds: Normal heart sounds.   Pulmonary:     Effort: Pulmonary effort is normal.     Breath sounds: Normal breath sounds.  Abdominal:     General: Bowel sounds are normal.     Palpations: Abdomen is soft.     Comments: Ostomy left abdomen, no bag in place, freely leaking stool  Musculoskeletal:        General: Normal range of motion.     Cervical back: Normal range of motion.  Skin:    General: Skin is warm and dry.  Neurological:     Mental Status: He is alert and oriented to person, place, and time.     ED Results / Procedures / Treatments   Labs (all labs ordered are listed, but only abnormal results are displayed) Labs Reviewed - No data to display  EKG None  Radiology No results found.  Procedures Procedures    Medications Ordered in ED Medications - No data to display  ED Course/ Medical Decision Making/ A&P                           Medical Decision Making  Patient given colostomy bag along with towels, washcloth, and soap to clean up.  After being given supplies, he promptly walked out of ED.  He did not allow vitals to be taken from triage.  Final Clinical Impression(s) /  ED Diagnoses Final diagnoses:  Colostomy care [facility])    Rx / DC Orders ED Discharge Orders     None         Garlon Hatchet, PA-C 03/18/22 0239    Zadie Rhine, MD 03/18/22 [phone]

## 2022-03-18 NOTE — ED Triage Notes (Addendum)
Patient requesting colostomy bag . Dried stool noted at his shirt.
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
## 2022-03-19 ENCOUNTER — Emergency Department (HOSPITAL_COMMUNITY)
Admission: EM | Admit: 2022-03-19 | Discharge: 2022-03-19 | Disposition: A | Discharge status: Home / Self Care | Payer: Medicaid Other | Attending: Emergency Medicine | Admitting: Emergency Medicine

## 2022-03-19 ENCOUNTER — Other Ambulatory Visit: Payer: Self-pay

## 2022-03-19 ENCOUNTER — Encounter (HOSPITAL_COMMUNITY): Payer: Self-pay | Admitting: Emergency Medicine

## 2022-03-19 DIAGNOSIS — Z933 Colostomy status: Secondary | ICD-10-CM | POA: Insufficient documentation

## 2022-03-19 DIAGNOSIS — Z433 Encounter for attention to colostomy: Secondary | ICD-10-CM

## 2022-03-19 NOTE — ED Notes (Signed)
Pt given colostomy supplies.  He did not have a bag on, so he had contents on his clothing.  RN gave him supplies to clean himself, colostomy supplies and paper scrubs to put on.  He went into the bathroom and clean himself and applied bag.    Pt does not have a phone thus he is happy to sit in the lobby until Social work arrives in the AM.

## 2022-03-19 NOTE — ED Triage Notes (Signed)
Per EMS, pt from [facility], "needs a new colostomy bag, no other complaints.  160pal HR 90 100% RA

## 2022-03-19 NOTE — ED Provider Notes (Signed)
[facility] EMERGENCY DEPARTMENT Provider Note   CSN: [id number] Arrival date & time: 03/19/22  0545     History  Chief Complaint  Patient presents with   needs colostomy bag    Dalton Moore is a 26 y.o. male who presents to the emergency department requesting colostomy supplies.  Does not currently have a colostomy bag, stool leaking freely.  Area is not painful.  Denies vomiting, hematochezia, or melena.  HPI     Home Medications Prior to Admission medications   Medication Sig Start Date End Date Taking? Authorizing Provider  buPROPion (WELLBUTRIN XL) 150 MG 24 hr tablet Take 1 tablet (150 mg total) by mouth daily. Patient not taking: Reported on 11/20/2021 11/15/21   Juliet Rude, PA-C  ibuprofen (ADVIL) 200 MG tablet Take 600 mg by mouth daily as needed for mild pain or moderate pain.    [provider]  oxyCODONE (OXY IR/ROXICODONE) 5 MG immediate release tablet Take 1 tablet (5 mg total) by mouth every 6 (six) hours as needed for severe pain. Patient not taking: Reported on 03/12/2022 11/23/21   Carlena Bjornstad A, PA-C  sodium chloride flush (NS) 0.9 % SOLN 5 mLs by Intracatheter route daily. Patient not taking: Reported on 03/12/2022 11/23/21   Franne Forts, PA-C      Allergies    Patient has no known allergies.    Review of Systems   Review of Systems  Constitutional:  Negative for chills and fever.  Gastrointestinal:  Negative for abdominal pain, blood in stool and vomiting.       Positive for needing ostomy bag.  Neurological:  Negative for syncope.  All other systems reviewed and are negative.   Physical Exam Updated Vital Signs BP (!) 138/106 (BP Location: Right Arm)   Pulse (!) 104   Temp 98.5 F (36.9 C) (Oral)   Resp 16   Ht 6\' 1"  (1.854 m)   Wt 73.5 kg   SpO2 100%   BMI 21.38 kg/m  Physical Exam Vitals and nursing note reviewed.  Constitutional:      Appearance: He is well-developed. He is not toxic-appearing.   HENT:     Head: Normocephalic and atraumatic.  Eyes:     General:        Right eye: No discharge.        Left eye: No discharge.     Conjunctiva/sclera: Conjunctivae normal.  Cardiovascular:     Rate and Rhythm: Normal rate and regular rhythm.  Pulmonary:     Effort: No respiratory distress.     Breath sounds: Normal breath sounds. No wheezing or rales.  Abdominal:     General: There is no distension.     Palpations: Abdomen is soft.     Tenderness: There is no abdominal tenderness. There is no guarding or rebound.     Comments: Ostomy left abdomen, no bag in place.  No surrounding erythema, induration, or fluctuance.    Musculoskeletal:     Cervical back: Neck supple.  Skin:    General: Skin is warm and dry.  Neurological:     Mental Status: He is alert.     Comments: Clear speech.      ED Results / Procedures / Treatments   Labs (all labs ordered are listed, but only abnormal results are displayed) Labs Reviewed - No data to display  EKG None  Radiology No results found.  Procedures Procedures    Medications Ordered in ED Medications - No  data to display  ED Course/ Medical Decision Making/ A&P                           Medical Decision Making  Patient presents to the emergency department requesting new colostomy bag.  Nontoxic, resting comfortably, mild tachycardia resolved on my exam.  BP somewhat elevated, doubt hypertensive emergency.  Abdomen nontender without peritoneal signs.  New ostomy bag placed.  Overall appears appropriate for discharge.  Consult placed to [facility] for possible assistance with his ostomy care given frequent ED visits for same.   Final Clinical Impression(s) / ED Diagnoses Final diagnoses:  Colostomy care [facility])    Rx / DC Orders ED Discharge Orders     None         Cherly Anderson, PA-C 03/19/22 0616    Tilden Fossa, MD 03/19/22 [phone]

## 2022-03-19 NOTE — Discharge Instructions (Addendum)
Please follow up with primary care as soon as possible. Have your blood pressure rechecked as it was elevated in the ER.  Return to the ED for new or worsening symptoms or any other concerns.

## 2022-03-23 ENCOUNTER — Encounter (HOSPITAL_COMMUNITY): Payer: Self-pay

## 2022-03-23 ENCOUNTER — Other Ambulatory Visit: Payer: Self-pay

## 2022-03-23 ENCOUNTER — Emergency Department (HOSPITAL_COMMUNITY)
Admission: EM | Admit: 2022-03-23 | Discharge: 2022-03-23 | Discharge status: Against Medical Advice | Payer: Medicaid Other | Attending: Emergency Medicine | Admitting: Emergency Medicine

## 2022-03-23 DIAGNOSIS — F309 Manic episode, unspecified: Secondary | ICD-10-CM | POA: Diagnosis present

## 2022-03-23 DIAGNOSIS — Z5321 Procedure and treatment not carried out due to patient leaving prior to being seen by health care provider: Secondary | ICD-10-CM | POA: Diagnosis not present

## 2022-03-23 DIAGNOSIS — Z931 Gastrostomy status: Secondary | ICD-10-CM | POA: Insufficient documentation

## 2022-03-23 NOTE — ED Notes (Signed)
No [employer] here nor security so patient walked out of ED and stated since no colostomy bag he is leaving

## 2022-03-23 NOTE — ED Triage Notes (Signed)
[place] after [facility] episode. Arrived with ostomy site open and covered in stool, manic behavior. Denies SI

## 2022-03-24 ENCOUNTER — Emergency Department (HOSPITAL_COMMUNITY)
Admission: EM | Admit: 2022-03-24 | Discharge: 2022-03-25 | Discharge status: Against Medical Advice | Payer: Medicaid Other | Attending: Emergency Medicine | Admitting: Emergency Medicine

## 2022-03-24 ENCOUNTER — Encounter (HOSPITAL_COMMUNITY): Payer: Self-pay

## 2022-03-24 DIAGNOSIS — R451 Restlessness and agitation: Secondary | ICD-10-CM | POA: Diagnosis present

## 2022-03-24 DIAGNOSIS — Z5321 Procedure and treatment not carried out due to patient leaving prior to being seen by health care provider: Secondary | ICD-10-CM | POA: Diagnosis not present

## 2022-03-24 LAB — BASIC METABOLIC PANEL
Anion gap: 6 (ref 5–15)
BUN: 12 mg/dL (ref 6–20)
CO2: 24 mmol/L (ref 22–32)
Calcium: 8.8 mg/dL — ABNORMAL LOW (ref 8.9–10.3)
Chloride: 112 mmol/L — ABNORMAL HIGH (ref 98–111)
Creatinine, Ser: 0.8 mg/dL (ref 0.61–1.24)
GFR, Estimated: >60 mL/min (ref >60)
Glucose, Bld: 82 mg/dL (ref 70–99)
Potassium: 3.7 mmol/L (ref 3.5–5.1)
Sodium: 142 mmol/L (ref 135–145)

## 2022-03-24 LAB — CBC WITH DIFFERENTIAL/PLATELET
Abs Immature Granulocytes: 0.01 10*3/uL (ref 0.00–0.07)
Basophils Absolute: 0.1 10*3/uL (ref 0.0–0.1)
Basophils Relative: 1 %
Eosinophils Absolute: 0.6 10*3/uL — ABNORMAL HIGH (ref 0.0–0.5)
Eosinophils Relative: 9 %
HCT: 41.3 % (ref 39.0–52.0)
Hemoglobin: 13.4 g/dL (ref 13.0–17.0)
Immature Granulocytes: 0 %
Lymphocytes Relative: 31 %
Lymphs Abs: 2.3 10*3/uL (ref 0.7–4.0)
MCH: 27.7 pg (ref 26.0–34.0)
MCHC: 32.4 g/dL (ref 30.0–36.0)
MCV: 85.3 fL (ref 80.0–100.0)
Monocytes Absolute: 0.8 10*3/uL (ref 0.1–1.0)
Monocytes Relative: 11 %
Neutro Abs: 3.5 10*3/uL (ref 1.7–7.7)
Neutrophils Relative %: 48 %
Platelets: 472 10*3/uL — ABNORMAL HIGH (ref 150–400)
RBC: 4.84 MIL/uL (ref 4.22–5.81)
RDW: 16.9 % — ABNORMAL HIGH (ref 11.5–15.5)
WBC: 7.2 10*3/uL (ref 4.0–10.5)
nRBC: 0 % (ref 0.0–0.2)

## 2022-03-24 LAB — ACETAMINOPHEN LEVEL: Acetaminophen (Tylenol), Serum: <10 ug/mL — ABNORMAL LOW (ref 10–30)

## 2022-03-24 LAB — ETHANOL: Alcohol, Ethyl (B): <10 mg/dL (ref <10)

## 2022-03-24 LAB — SALICYLATE LEVEL: Salicylate Lvl: <7 mg/dL — ABNORMAL LOW (ref 7.0–30.0)

## 2022-03-24 MED ORDER — LORAZEPAM 2 MG/ML IJ SOLN
2.0000 mg | Freq: Once | INTRAMUSCULAR | Status: AC
Start: 2022-03-24 — End: 2022-03-24
  Administered 2022-03-24: 2 mg via INTRAMUSCULAR
  Filled 2022-03-24: qty 1

## 2022-03-24 NOTE — ED Notes (Signed)
Pt remains uncooperative w/ labs and EKG.

## 2022-03-24 NOTE — ED Notes (Signed)
Pt refused EKG.
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
## 2022-03-24 NOTE — ED Notes (Signed)
Unable to obtain EKG, labs or VS d/t pt's manic state. Dr. Rodena Medin is aware.
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
## 2022-03-24 NOTE — BH Assessment (Signed)
Attempted tele-assessment. Pt is somnolent and staying awake long enough to answer questions.   Pamalee Leyden, [facility], [place] Triage Specialist [phone]

## 2022-03-24 NOTE — ED Notes (Signed)
Dalton Moore [phone]. (Pt's mom)

## 2022-03-24 NOTE — ED Notes (Signed)
Save gold and red tube in main lab

## 2022-03-24 NOTE — ED Notes (Signed)
Pt in bed sleeping no signs of distress.

## 2022-03-24 NOTE — ED Provider Notes (Signed)
[facility]-EMERGENCY DEPT Provider Note   CSN: [id number] Arrival date & time: 03/24/22  1230     History  No chief complaint on file.   Dalton Moore is a 26 y.o. male.  26 year old male with prior medical history as detailed below presents for evaluation.  Underlying reason for patient's presentation is unclear.  Patient appears to be quite manic.  He is disorganized in his thought processes.  He has taken off his ostomy bag or lost it.  He is covered with feces from his ostomy site.  With repeated questioning the patient is unable to provide a coherent story.    Patient is laughing intermittently secondary to apparent unseen stimuli.  The history is provided by the patient and medical records.       Home Medications Prior to Admission medications   Medication Sig Start Date End Date Taking? Authorizing Provider  buPROPion (WELLBUTRIN XL) 150 MG 24 hr tablet Take 1 tablet (150 mg total) by mouth daily. Patient not taking: Reported on 11/20/2021 11/15/21   Juliet Rude, PA-C  ibuprofen (ADVIL) 200 MG tablet Take 600 mg by mouth daily as needed for mild pain or moderate pain.    [provider]  oxyCODONE (OXY IR/ROXICODONE) 5 MG immediate release tablet Take 1 tablet (5 mg total) by mouth every 6 (six) hours as needed for severe pain. Patient not taking: Reported on 03/12/2022 11/23/21   Carlena Bjornstad A, PA-C  sodium chloride flush (NS) 0.9 % SOLN 5 mLs by Intracatheter route daily. Patient not taking: Reported on 03/12/2022 11/23/21   Franne Forts, PA-C      Allergies    Patient has no known allergies.    Review of Systems   Review of Systems  All other systems reviewed and are negative.   Physical Exam Updated Vital Signs BP 125/72   Pulse 77   Temp 98.1 F (36.7 C)   Resp 18   SpO2 97%  Physical Exam Vitals and nursing note reviewed.  Constitutional:      General: He is not in acute distress.    Appearance: He is  well-developed.  HENT:     Head: Normocephalic and atraumatic.  Eyes:     Conjunctiva/sclera: Conjunctivae normal.     Pupils: Pupils are equal, round, and reactive to light.  Cardiovascular:     Rate and Rhythm: Normal rate and regular rhythm.     Heart sounds: Normal heart sounds.  Pulmonary:     Effort: Pulmonary effort is normal. No respiratory distress.     Breath sounds: Normal breath sounds.  Abdominal:     General: There is no distension.     Palpations: Abdomen is soft.     Tenderness: There is no abdominal tenderness.  Musculoskeletal:        General: No deformity. Normal range of motion.     Cervical back: Normal range of motion and neck supple.  Skin:    General: Skin is warm and dry.  Neurological:     General: No focal deficit present.     Mental Status: He is alert.     Comments: Alert  Disheveled, covered with stool  Speech is pressured.  Thoughts are incoherent.  Patient appears to be responding to unseen stimuli.     ED Results / Procedures / Treatments   Labs (all labs ordered are listed, but only abnormal results are displayed) Labs Reviewed  BASIC METABOLIC PANEL  ACETAMINOPHEN LEVEL  ETHANOL  RAPID  URINE DRUG SCREEN, HOSP PERFORMED  CBC WITH DIFFERENTIAL/PLATELET  SALICYLATE LEVEL    EKG None  Radiology No results found.  Procedures Procedures    Medications Ordered in ED Medications  LORazepam (ATIVAN) injection 2 mg (2 mg Intramuscular Given 03/24/22 1614)    ED Course/ Medical Decision Making/ A&P                           Medical Decision Making Amount and/or Complexity of Data Reviewed Labs: ordered.  Risk Prescription drug management.    Medical Screen Complete  This patient presented to the ED with complaint of agitation/psychosis.  This complaint involves an extensive number of treatment options. The initial differential diagnosis includes, but is not limited to, mental health issue, metabolic abnormality,  etc  This presentation is: Acute, Chronic, Self-Limited, Previously Undiagnosed, Uncertain Prognosis, Complicated, and Systemic Symptoms  Patient is presenting with agitation, mania, possible psychosis.  Patient requires mental health evaluation by TTS.  Patient is medically clear at this time for further evaluation by psychiatry.  Final disposition dependent on plan of care per TTS / psychiatry.      Additional history obtained:  External records from outside sources obtained and reviewed including prior ED visits and prior Inpatient records.    Lab Tests:  I ordered and personally interpreted labs.  The pertinent results include:  CBC BMP Salicylate, acetaminophen, ethanol, urine tox screen  Cardiac Monitoring:  The patient was maintained on a cardiac monitor.  I personally viewed and interpreted the cardiac monitor which showed an underlying rhythm of: NSR   Medicines ordered:  I ordered medication including ativan  for agitation  Reevaluation of the patient after these medicines showed that the patient: improved  Problem List / ED Course:  Agitation    Reevaluation:  After the interventions noted above, I reevaluated the patient and found that they have: improved  Disposition:  After consideration of the diagnostic results and the patients response to treatment, I feel that the patent would benefit from TTS/Psych evaluation.          Final Clinical Impression(s) / ED Diagnoses Final diagnoses:  Agitation    Rx / DC Orders ED Discharge Orders     None         Wynetta Fines, MD 03/24/22 2231

## 2022-03-24 NOTE — ED Provider Triage Note (Signed)
Emergency Medicine Provider Triage Evaluation Note  Dalton Moore , a 26 y.o. male  was evaluated in triage.  Patient is acutely psychotic and manic.  Talking to himself.  He undresses and is missing his ostomy bag.  Dried stool all over his body.  Review of Systems  Unable to provide his own history.  Physical Exam  BP 125/72   Pulse 77   Temp 98.1 F (36.7 C)   Resp 18   SpO2 97%  Gen:   Awake, no distress   Resp:  Normal effort  MSK:   Moves extremities without difficulty  Other:  Dried stool all over patient's body, talking to himself  Medical Decision Making  Medically screening exam initiated at 12:45 PM.  Appropriate orders placed.  Dalton Moore was informed that the remainder of the evaluation will be completed by another provider, this initial triage assessment does not replace that evaluation, and the importance of remaining in the ED until their evaluation is complete.  Patient presented for similar yesterday   [name], PA-C 03/24/22 1311

## 2022-03-24 NOTE — ED Notes (Signed)
Attempted to get blood work from pt. Pt kept moving and then told me to stop trying to get the blood. I advised the pt if he would not tense up and move while we are sticking him we would have gotten the blood. Pt refuses to allow another attempt.  -BW

## 2022-03-25 ENCOUNTER — Emergency Department (HOSPITAL_COMMUNITY)
Admission: EM | Admit: 2022-03-25 | Discharge: 2022-03-25 | Discharge status: Against Medical Advice | Payer: Medicaid Other

## 2022-03-25 NOTE — ED Provider Notes (Signed)
Emergency Medicine Observation Re-evaluation Note  Dalton Moore is a 26 y.o. male, seen on rounds today.  Pt initially presented to the ED for complaints of No chief complaint on file. Currently, the patient is awake and demanding to be discharged.  While the patient is certainly angry about his stay in the emergency department, he is is not actively displaying evidence of active psychosis here after a 20-hour stay and previous medication by previous provider.  When I attempted to evaluate the patient, he told me "I am getting the fuck out of here I do not care what you say".  I attempted to verbally redirect the patient but was unsuccessful.  He has been intermittently aggressive towards hospital staff attempting to question why he wants to leave the hospital but ultimately eloped from the emergency department.  As the patient is not under IVC, I do not have legal grounds to forcibly have the patient return to the emergency department against his will  Physical Exam  BP 130/75 (BP Location: Right Arm)   Pulse 60   Temp 98 F (36.7 C) (Oral)   Resp 18   SpO2 100%  Physical Exam Vitals and nursing note reviewed.  Constitutional:      General: He is not in acute distress.    Appearance: He is well-developed.  HENT:     Head: Normocephalic and atraumatic.  Eyes:     Conjunctiva/sclera: Conjunctivae normal.  Cardiovascular:     Rate and Rhythm: Normal rate and regular rhythm.     Heart sounds: No murmur heard. Pulmonary:     Effort: Pulmonary effort is normal. No respiratory distress.  Musculoskeletal:        General: No swelling.     Cervical back: Neck supple.  Skin:    General: Skin is warm and dry.  Neurological:     Mental Status: He is alert.  Psychiatric:        Mood and Affect: Mood normal.      ED Course / MDM  EKG:   I have reviewed the labs performed to date as well as medications administered while in observation.  Recent changes in the last 24 hours include patient  awoke and eloped from the emergency department.  Plan  Patient eloped, not under IVC    Glendora Score, MD 03/25/22 [phone]
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
## 2022-03-25 NOTE — ED Notes (Signed)
Pt given 2 sandwiches and 6 peanut butter cups.

## 2022-03-25 NOTE — ED Notes (Signed)
Pt given 6 peanut butter cups and 2 ginger ales.

## 2022-03-25 NOTE — ED Triage Notes (Signed)
EMS stated, he was discharged from [facility] for the same colostomy bag change. Requested his colostomy bag changed and is hungry.

## 2022-03-26 ENCOUNTER — Emergency Department (HOSPITAL_COMMUNITY)
Admission: EM | Admit: 2022-03-26 | Discharge: 2022-03-26 | Discharge status: Against Medical Advice | Payer: Medicaid Other | Source: Home / Self Care

## 2022-03-26 ENCOUNTER — Other Ambulatory Visit: Payer: Self-pay

## 2022-03-26 ENCOUNTER — Emergency Department (HOSPITAL_COMMUNITY)
Admission: EM | Admit: 2022-03-26 | Discharge: 2022-03-26 | Discharge status: Against Medical Advice | Payer: Medicaid Other | Attending: Emergency Medicine | Admitting: Emergency Medicine

## 2022-03-26 DIAGNOSIS — Z5321 Procedure and treatment not carried out due to patient leaving prior to being seen by health care provider: Secondary | ICD-10-CM | POA: Insufficient documentation

## 2022-03-26 DIAGNOSIS — Z933 Colostomy status: Secondary | ICD-10-CM | POA: Insufficient documentation

## 2022-03-26 DIAGNOSIS — R451 Restlessness and agitation: Secondary | ICD-10-CM | POA: Diagnosis not present

## 2022-03-26 NOTE — ED Notes (Signed)
Pt brought back to Triage.  Pt given soap, towel and supplies for colostomy.  He refused to go into the bathroom or his room to clean himself up.  Pt had feces from open colostomy all over himself.  Pt had also been given new clean scrubs.  He continued to refuse to go in and take care of himself.  Security called, pt decided to leave hospital. He did take all the supplies w/ him.
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
## 2022-03-28 ENCOUNTER — Other Ambulatory Visit: Payer: Self-pay

## 2022-03-28 ENCOUNTER — Encounter (HOSPITAL_COMMUNITY): Payer: Self-pay | Admitting: Emergency Medicine

## 2022-03-28 ENCOUNTER — Emergency Department (HOSPITAL_COMMUNITY)
Admission: EM | Admit: 2022-03-28 | Discharge: 2022-03-29 | Disposition: A | Discharge status: Home / Self Care | Payer: Medicaid Other | Source: Home / Self Care | Attending: Emergency Medicine | Admitting: Emergency Medicine

## 2022-03-28 ENCOUNTER — Emergency Department (HOSPITAL_COMMUNITY)
Admission: EM | Admit: 2022-03-28 | Discharge: 2022-03-28 | Discharge status: Against Medical Advice | Payer: Medicaid Other | Attending: Emergency Medicine | Admitting: Emergency Medicine

## 2022-03-28 DIAGNOSIS — R45851 Suicidal ideations: Secondary | ICD-10-CM | POA: Insufficient documentation

## 2022-03-28 DIAGNOSIS — R109 Unspecified abdominal pain: Secondary | ICD-10-CM | POA: Diagnosis present

## 2022-03-28 DIAGNOSIS — Z5321 Procedure and treatment not carried out due to patient leaving prior to being seen by health care provider: Secondary | ICD-10-CM | POA: Insufficient documentation

## 2022-03-28 NOTE — ED Triage Notes (Signed)
ED with c/o suicidal ideation with no plan.

## 2022-03-28 NOTE — ED Triage Notes (Signed)
Per EMS-Patient c/o abdominal pain. Patient was seen several times this week for need for colostomy supplies. EMS reports that the patient does not have a colostomy bag on.

## 2022-03-28 NOTE — ED Provider Triage Note (Signed)
Emergency Medicine Provider Triage Evaluation Note  Dalton Moore , a 26 y.o. male  was evaluated in triage.  Pt complains of suicidal ideation without plan.  Denies HI, or AVH.  Review of Systems  Positive: As above Negative: As above  Physical Exam  BP (!) 130/100   Pulse 99   Temp 98.2 F (36.8 C) (Oral)   Resp 18   Ht 6\' 1"  (1.854 m)   Wt 73.5 kg   SpO2 98%   BMI 21.38 kg/m  Gen:   Awake, no distress   Resp:  Normal effort  MSK:   Moves extremities without difficulty  Other:    Medical Decision Making  Medically screening exam initiated at 11:38 PM.  Appropriate orders placed.  Keyston Ardolino was informed that the remainder of the evaluation will be completed by another provider, this initial triage assessment does not replace that evaluation, and the importance of remaining in the ED until their evaluation is complete.     Bing Quarry, PA-C 03/28/22 2339

## 2022-03-29 ENCOUNTER — Emergency Department (HOSPITAL_COMMUNITY)
Admission: EM | Admit: 2022-03-29 | Discharge: 2022-03-29 | Disposition: A | Discharge status: Home / Self Care | Payer: Medicaid Other

## 2022-03-29 NOTE — ED Provider Notes (Signed)
[facility]-EMERGENCY DEPT Provider Note   CSN: [id number] Arrival date & time: 03/28/22  2319     History  Chief Complaint  Patient presents with   Suicidal    Dalton Moore is a 26 y.o. male.  HPI Patient presented for multiple complaints.  He had originally arrived via EMS for abdominal pain and concerned about his colostomy.  After arrival he told nursing that he was suicidal without any plan.  No homicidal thoughts were reported. On my arrival to room, patient reports suicidal but has no plan.  He reports he has to be admitted since he is suicidal No other complaints    Home Medications Prior to Admission medications   Medication Sig Start Date End Date Taking? Authorizing Provider  buPROPion (WELLBUTRIN XL) 150 MG 24 hr tablet Take 1 tablet (150 mg total) by mouth daily. Patient not taking: Reported on 11/20/2021 11/15/21   Juliet Rude, PA-C  ibuprofen (ADVIL) 200 MG tablet Take 600 mg by mouth daily as needed for mild pain or moderate pain.    [provider]  oxyCODONE (OXY IR/ROXICODONE) 5 MG immediate release tablet Take 1 tablet (5 mg total) by mouth every 6 (six) hours as needed for severe pain. Patient not taking: Reported on 03/12/2022 11/23/21   Carlena Bjornstad A, PA-C  sodium chloride flush (NS) 0.9 % SOLN 5 mLs by Intracatheter route daily. Patient not taking: Reported on 03/12/2022 11/23/21   Franne Forts, PA-C      Allergies    Patient has no known allergies.    Review of Systems   Review of Systems  Physical Exam Updated Vital Signs BP (!) 130/100   Pulse 99   Temp 98.2 F (36.8 C) (Oral)   Resp 18   Ht 1.854 m (6\' 1" )   Wt 73.5 kg   SpO2 98%   BMI 21.38 kg/m  Physical Exam CONSTITUTIONAL: Disheveled, anxious HEAD: Normocephalic/atraumatic EYES: EOMI NEURO: Pt is awake/alert, moves all extremitiesx4.   EXTREMITIES: full ROM SKIN: warm, color normal PSYCH: Anxious, but no signs of psychosis at this time.  ED  Results / Procedures / Treatments   Labs (all labs ordered are listed, but only abnormal results are displayed) Labs Reviewed - No data to display  EKG None  Radiology No results found.  Procedures Procedures    Medications Ordered in ED Medications - No data to display  ED Course/ Medical Decision Making/ A&P Clinical Course as of 03/29/22 0027  Sat Mar 29, 2022  0018 Patient denies any active suicidal plan.  He did not appear psychotic.  Patient was had multiple evaluations previously.  Patient became aggressive and started coming towards me in a threatening manner.  I advised patient that he can follow-up 24 hours a day at the behavioral health urgent care.  Patient discharged with security [DW]    Clinical Course User Index [DW] Mar 31, 2022, MD                           Medical Decision Making  Patient presented for 15th ER visit in 6 months.  He had multiple evaluation previously with recent behavioral health evaluation. At this point I feel he is low risk for harm and advised he can go to the behavioral urgent care 24 hours a day        Final Clinical Impression(s) / ED Diagnoses Final diagnoses:  Suicidal thoughts    Rx / DC  Orders ED Discharge Orders     None         Zadie Rhine, MD 03/29/22 0030

## 2022-03-30 ENCOUNTER — Emergency Department (HOSPITAL_COMMUNITY)
Admission: EM | Admit: 2022-03-30 | Discharge: 2022-04-11 | Disposition: A | Discharge status: Home / Self Care | Payer: Medicaid Other | Attending: Emergency Medicine | Admitting: Emergency Medicine

## 2022-03-30 DIAGNOSIS — F209 Schizophrenia, unspecified: Secondary | ICD-10-CM | POA: Diagnosis not present

## 2022-03-30 DIAGNOSIS — R461 Bizarre personal appearance: Secondary | ICD-10-CM | POA: Diagnosis present

## 2022-03-30 DIAGNOSIS — Y900 Blood alcohol level of less than 20 mg/100 ml: Secondary | ICD-10-CM | POA: Diagnosis not present

## 2022-03-30 DIAGNOSIS — F4310 Post-traumatic stress disorder, unspecified: Secondary | ICD-10-CM | POA: Diagnosis not present

## 2022-03-30 DIAGNOSIS — R462 Strange and inexplicable behavior: Secondary | ICD-10-CM | POA: Diagnosis not present

## 2022-03-30 DIAGNOSIS — Z20822 Contact with and (suspected) exposure to covid-19: Secondary | ICD-10-CM | POA: Insufficient documentation

## 2022-03-30 DIAGNOSIS — F203 Undifferentiated schizophrenia: Secondary | ICD-10-CM | POA: Diagnosis not present

## 2022-03-30 DIAGNOSIS — F23 Brief psychotic disorder: Secondary | ICD-10-CM

## 2022-03-30 LAB — CBC WITH DIFFERENTIAL/PLATELET
Abs Immature Granulocytes: 0.02 10*3/uL (ref 0.00–0.07)
Basophils Absolute: 0.1 10*3/uL (ref 0.0–0.1)
Basophils Relative: 1 %
Eosinophils Absolute: 0.2 10*3/uL (ref 0.0–0.5)
Eosinophils Relative: 3 %
HCT: 43.9 % (ref 39.0–52.0)
Hemoglobin: 14.1 g/dL (ref 13.0–17.0)
Immature Granulocytes: 0 %
Lymphocytes Relative: 24 %
Lymphs Abs: 1.6 10*3/uL (ref 0.7–4.0)
MCH: 27.7 pg (ref 26.0–34.0)
MCHC: 32.1 g/dL (ref 30.0–36.0)
MCV: 86.2 fL (ref 80.0–100.0)
Monocytes Absolute: 0.9 10*3/uL (ref 0.1–1.0)
Monocytes Relative: 13 %
Neutro Abs: 3.9 10*3/uL (ref 1.7–7.7)
Neutrophils Relative %: 59 %
Platelets: 571 10*3/uL — ABNORMAL HIGH (ref 150–400)
RBC: 5.09 MIL/uL (ref 4.22–5.81)
RDW: 16.9 % — ABNORMAL HIGH (ref 11.5–15.5)
WBC: 6.8 10*3/uL (ref 4.0–10.5)
nRBC: 0 % (ref 0.0–0.2)

## 2022-03-30 LAB — COMPREHENSIVE METABOLIC PANEL
ALT: 23 U/L (ref 0–44)
AST: 27 U/L (ref 15–41)
Albumin: 4 g/dL (ref 3.5–5.0)
Alkaline Phosphatase: 121 U/L (ref 38–126)
Anion gap: 7 (ref 5–15)
BUN: 16 mg/dL (ref 6–20)
CO2: 28 mmol/L (ref 22–32)
Calcium: 9.3 mg/dL (ref 8.9–10.3)
Chloride: 109 mmol/L (ref 98–111)
Creatinine, Ser: 1.03 mg/dL (ref 0.61–1.24)
GFR, Estimated: >60 mL/min (ref >60)
Glucose, Bld: 72 mg/dL (ref 70–99)
Potassium: 4.2 mmol/L (ref 3.5–5.1)
Sodium: 144 mmol/L (ref 135–145)
Total Bilirubin: 0.6 mg/dL (ref 0.3–1.2)
Total Protein: 7 g/dL (ref 6.5–8.1)

## 2022-03-30 LAB — RESP PANEL BY RT-PCR (FLU A&B, COVID) ARPGX2
Influenza A by PCR: NEGATIVE
Influenza B by PCR: NEGATIVE
SARS Coronavirus 2 by RT PCR: NEGATIVE

## 2022-03-30 LAB — ETHANOL: Alcohol, Ethyl (B): <10 mg/dL (ref <10)

## 2022-03-30 MED ORDER — LORAZEPAM 1 MG PO TABS
1.0000 mg | ORAL_TABLET | ORAL | Status: AC | PRN
  Administered 2022-03-31: 1 mg via ORAL
  Filled 2022-03-30: qty 1

## 2022-03-30 MED ORDER — HALOPERIDOL LACTATE 5 MG/ML IJ SOLN
5.0000 mg | INTRAMUSCULAR | Status: AC
  Administered 2022-03-30: 5 mg via INTRAMUSCULAR
  Filled 2022-03-30: qty 1

## 2022-03-30 MED ORDER — STERILE WATER FOR INJECTION IJ SOLN
INTRAMUSCULAR | Status: AC
  Administered 2022-03-30: 1.2 mL
  Filled 2022-03-30: qty 10

## 2022-03-30 MED ORDER — ZIPRASIDONE MESYLATE 20 MG IM SOLR
20.0000 mg | Freq: Once | INTRAMUSCULAR | Status: AC
  Administered 2022-03-30: 20 mg via INTRAMUSCULAR
  Filled 2022-03-30: qty 20

## 2022-03-30 MED ORDER — OLANZAPINE 10 MG PO TBDP
10.0000 mg | ORAL_TABLET | Freq: Every day | ORAL | Status: DC
  Administered 2022-03-30 – 2022-03-31 (×2): 10 mg via ORAL
  Filled 2022-03-30 (×2): qty 1

## 2022-03-30 MED ORDER — ZIPRASIDONE MESYLATE 20 MG IM SOLR
20.0000 mg | INTRAMUSCULAR | Status: AC | PRN
  Filled 2022-03-30: qty 20

## 2022-03-30 MED ORDER — OLANZAPINE 10 MG PO TBDP
10.0000 mg | ORAL_TABLET | Freq: Three times a day (TID) | ORAL | Status: DC | PRN
  Administered 2022-04-01: 10 mg via ORAL

## 2022-03-30 MED ORDER — ACETAMINOPHEN 325 MG PO TABS
650.0000 mg | ORAL_TABLET | ORAL | Status: DC | PRN
  Administered 2022-04-06: 650 mg via ORAL
  Filled 2022-03-30 (×2): qty 2

## 2022-03-30 NOTE — ED Notes (Signed)
Pt becoming increasingly paranoid. Pt believes that we are talking on the phone with his mother. Pt talking about wanting to kill his mother. States he would shoot her house up. States when she dies he wants to piss on her grave. States he is ready to go back to hell. He feels as though his eyes are black from being in hell. Pt telling the sitter that she better not come anywhere near him for fear of what he may do. He states he would rather be with his murderer friends than here. Pt also talks about not wanting to go to heaven. States heaven isnt really and he would kill god if given the chance. Pt aware he isnt able to leave at this time. He understands.

## 2022-03-30 NOTE — ED Notes (Signed)
Pt provided with crackers and soda

## 2022-03-30 NOTE — ED Provider Notes (Signed)
Patient agitated yelling at staff.  Responding to internal stimuli.  5 mg of intramuscular Haldol ordered.   Rondel Baton, MD 03/30/22 [phone]

## 2022-03-30 NOTE — ED Notes (Signed)
Dinner tray delivered to patients room.

## 2022-03-30 NOTE — ED Provider Notes (Signed)
[facility]-EMERGENCY DEPT Provider Note   CSN: [id number] Arrival date & time: 03/30/22  1012     History  Chief Complaint  Patient presents with   Psychiatric Evaluation    Dalton Moore is a 26 y.o. male.  HPI Patient was brought in by [employer] with IVC.  Patient reportedly was wielding a box cutter and threatening people in a public area.  Patient has a colostomy and the bag was removed.  Patient was covered in dried fecal material wearing paper scrub pants.  He required physical restraint for transport and was extremely agitated and physically aggressive.  Patient initially will not speak to me.  He will just stare at me intensely.  When I returned to do some physical exam, he reported he did not want me to touch him at all.  Later, he would answer a few questions and demanded that beverage be said at his bedside even though he is in restraints he just wanted to "look at it".    Home Medications Prior to Admission medications   Medication Sig Start Date End Date Taking? Authorizing Provider  buPROPion (WELLBUTRIN XL) 150 MG 24 hr tablet Take 1 tablet (150 mg total) by mouth daily. Patient not taking: Reported on 11/20/2021 11/15/21   Juliet Rude, PA-C  ibuprofen (ADVIL) 200 MG tablet Take 600 mg by mouth daily as needed for mild pain or moderate pain.    [provider]  oxyCODONE (OXY IR/ROXICODONE) 5 MG immediate release tablet Take 1 tablet (5 mg total) by mouth every 6 (six) hours as needed for severe pain. Patient not taking: Reported on 03/12/2022 11/23/21   Carlena Bjornstad A, PA-C  sodium chloride flush (NS) 0.9 % SOLN 5 mLs by Intracatheter route daily. Patient not taking: Reported on 03/12/2022 11/23/21   Franne Forts, PA-C      Allergies    Patient has no known allergies.    Review of Systems   Review of Systems 5 caveat cannot obtain review of systems due to patient condition. Physical Exam Updated Vital Signs BP 118/74   Pulse (!) 46    Resp 12   SpO2 99%  Physical Exam Constitutional:      Comments: Well-nourished well-developed.  Extremely agitated and fighting with police and kicking.  No respiratory distress.  Patient has an ostomy that does not have a bag on it.  He has dried fecal material all over his chest abdomen and prescribed pants that he is wearing.  HENT:     Head: Normocephalic and atraumatic.     Mouth/Throat:     Pharynx: Oropharynx is clear.  Eyes:     Extraocular Movements: Extraocular movements intact.  Cardiovascular:     Rate and Rhythm: Normal rate and regular rhythm.  Pulmonary:     Effort: Pulmonary effort is normal.     Breath sounds: Normal breath sounds.  Abdominal:     General: There is no distension.     Palpations: Abdomen is soft.     Tenderness: There is no abdominal tenderness. There is no guarding.     Comments: Ostomy in good condition.  Abdomen is flat and nondistended.  Musculoskeletal:        General: Normal range of motion.     Comments: Patient is using all 4 extremities to resist and fight the police.  He has excellent strength throughout.  No evident deformities or significant injuries.  Skin:    General: Skin is warm and dry.  Neurological:     Comments: Patient is very alert and hypervigilant.  Speech is pressured.  All movements are coordinated purposeful and symmetric.  Excellent motor strength x4 extremities.  Psychiatric:     Comments: Extremely agitated and hostile     ED Results / Procedures / Treatments   Labs (all labs ordered are listed, but only abnormal results are displayed) Labs Reviewed  CBC WITH DIFFERENTIAL/PLATELET - Abnormal; Notable for the following components:      Result Value   RDW 16.9 (*)    Platelets 571 (*)    All other components within normal limits  RESP PANEL BY RT-PCR (FLU A&B, COVID) [id number]  COMPREHENSIVE METABOLIC PANEL  ETHANOL  RAPID URINE DRUG SCREEN, HOSP PERFORMED    EKG EKG Interpretation  Date/Time:  Sunday  March 30 2022 11:27:25 EDT Ventricular Rate:  76 PR Interval:  142 QRS Duration: 103 QT Interval:  414 QTC Calculation: 466 R Axis:   39 Text Interpretation: Sinus rhythm Probable left atrial enlargement RSR' in V1 or V2, probably normal variant ST elev, probable normal early repol pattern agree, no old comparison Confirmed by Arby Barrette [id number]) on 03/30/2022 3:45:00 PM  Radiology No results found.  Procedures Procedures   CRITICAL CARE Performed by: Arby Barrette   Total critical care time: 30 minutes  Critical care time was exclusive of separately billable procedures and treating other patients.  Critical care was necessary to treat or prevent imminent or life-threatening deterioration.  Critical care was time spent personally by me on the following activities: development of treatment plan with patient and/or surrogate as well as nursing, discussions with consultants, evaluation of patient's response to treatment, examination of patient, obtaining history from patient or surrogate, ordering and performing treatments and interventions, ordering and review of laboratory studies, ordering and review of radiographic studies, pulse oximetry and re-evaluation of patient's condition.  Medications Ordered in ED Medications  acetaminophen (TYLENOL) tablet 650 mg (has no administration in time range)  ziprasidone (GEODON) injection 20 mg (20 mg Intramuscular Given 03/30/22 1059)  sterile water (preservative free) injection (1.2 mLs  Given 03/30/22 1059)    ED Course/ Medical Decision Making/ A&P                           Medical Decision Making Amount and/or Complexity of Data Reviewed Labs: ordered.  Risk OTC drugs. Prescription drug management.   Patient presented with severely decompensated schizophrenia with psychosis.  Patient was extremely aggressive and exhibited very poor self-care.  Patient was requiring physical restraint for safety of the patient and staff.   Geodon 20 mg ordered.  After Geodon patient was more cooperative.  Still in four-point restraint but would speak in intelligible sentences that exhibited some situational awareness.  At that time then patient requested something to drink and was no longer immediately fighting with restraints.  EKG reviewed by myself shows some diffuse early repolarization no acute ischemic appearance and QTc 466.  Patient does not appear medically ill.  He is well-nourished well-developed.  His heart is regular.  Lungs are grossly clear.  Obtain basic lab work for psychiatric evaluation.  Metabolic panel within normal limits.  CBC within normal limits.  Patient is medically cleared for psychiatric assessment.  [employer] did IVC for transport to the emergency department, IVC paperwork first exam completed by myself.        Final Clinical Impression(s) / ED Diagnoses Final diagnoses:  Schizophrenia, unspecified type (HCC)  Acute psychosis [facility])    Rx / DC Orders ED Discharge Orders     None         Arby Barrette, MD 03/30/22 1551

## 2022-03-30 NOTE — ED Notes (Signed)
Pt is in soft restraints and refused vitals
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
## 2022-03-30 NOTE — ED Notes (Signed)
Burped pt colostomy bag.

## 2022-03-30 NOTE — ED Notes (Signed)
Emptied patient's colostomy bag.
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
## 2022-03-30 NOTE — ED Notes (Signed)
Since arriving at 3pm today this patient has received 4 breakfast sandwiches, 10 packs of peanut butter, and 4 cups of sprite.

## 2022-03-31 DIAGNOSIS — R462 Strange and inexplicable behavior: Secondary | ICD-10-CM

## 2022-03-31 DIAGNOSIS — F209 Schizophrenia, unspecified: Secondary | ICD-10-CM

## 2022-03-31 DIAGNOSIS — F203 Undifferentiated schizophrenia: Secondary | ICD-10-CM

## 2022-03-31 LAB — RAPID URINE DRUG SCREEN, HOSP PERFORMED
Amphetamines: NOT DETECTED
Barbiturates: NOT DETECTED
Benzodiazepines: POSITIVE — AB
Cocaine: NOT DETECTED
Opiates: NOT DETECTED
Tetrahydrocannabinol: POSITIVE — AB

## 2022-03-31 MED ORDER — STERILE WATER FOR INJECTION IJ SOLN
INTRAMUSCULAR | Status: AC
  Filled 2022-03-31: qty 10

## 2022-03-31 NOTE — Progress Notes (Signed)
Inpatient Behavioral Health Placement  Pt meets inpatient criteria per Earney Navy, NP. There are no available beds at [facility] per [facility].  Referral was sent to the following facilities;  Destination Service Provider Address Phone Fax  [facility]  [address] [place] [address] [phone]  [facility]  [address] [place], [place] [address] [phone]  [facility]  [address] [place] [address] [phone]  [facility]  [address] [place], [place] [address] [phone]  [facility]  [address] [place] [address] [phone]  [facility]  [address] [place] [address] [phone]  [facility]  [address], [place] [address] [phone]  [facility]  [address] [place] [address] [phone]  [facility]  [address] [place] [address] [phone]  [facility]  [address] [place] [address] [phone]  [facility]  [address] [place] [address] [phone]  [facility]  [address], [place] [address] [phone]  [facility]  [address] [place] [address] [phone]     Situation ongoing,  CSW will follow up.   Maryjean Ka, MSW, LCSWA 03/31/2022  @ 5:28 PM

## 2022-03-31 NOTE — ED Notes (Signed)
Pt given a sandwich and juice per request at this time, very cooperative.
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
## 2022-03-31 NOTE — ED Notes (Signed)
Pt's breakfast tray arrived 

## 2022-03-31 NOTE — Progress Notes (Addendum)
This CSW contacted [facility] to refer the pt for outpatient Mental Health services. Lelon Mast, Licensed [employer] Clinician at  [facility] informed that they don't make any hospital discharge appointments and that the pt would need to call and schedule an appt for himself. Lelon Mast provided the facilities that the pt could follow up with. This CSW is attaching them to the pt's AVS. TOC signing off.   Jennette Banker [facility]

## 2022-03-31 NOTE — ED Notes (Signed)
When inquiring about the patient's colostomy bag, the patient became verbally aggressive. He referred to staff with inappropriate names and threatened bodily harm to staff. Some words used were intelligible while other words were not. The patient switched into seemingly a different character while tell this writer that if anyone touched him he would release all the demons.

## 2022-03-31 NOTE — ED Provider Notes (Signed)
Emergency Medicine Observation Re-evaluation Note  Dalton Moore is a 26 y.o. male, seen on rounds today.  Pt initially presented to the ED for complaints of Psychiatric Evaluation Currently, the patient is resting.  Overnight, pt became verbally aggressive and required haldol IM.  Per sitter, pt has been up intermittently since then.  He is now alert.  Physical Exam  BP 99/69   Pulse (!) 52   Temp 97.7 F (36.5 C) (Axillary)   Resp 14   SpO2 98%  Physical Exam General: asleep Cardiac: rr Lungs: breathing well Psych: asleep  ED Course / MDM  EKG:EKG Interpretation  Date/Time:  Sunday March 30 2022 11:27:25 EDT Ventricular Rate:  76 PR Interval:  142 QRS Duration: 103 QT Interval:  414 QTC Calculation: 466 R Axis:   39 Text Interpretation: Sinus rhythm Probable left atrial enlargement RSR' in V1 or V2, probably normal variant ST elev, probable normal early repol pattern agree, no old comparison Confirmed by Arby Barrette [phone]) on 03/30/2022 3:45:00 PM  I have reviewed the labs performed to date as well as medications administered while in observation.  Recent changes in the last 24 hours include Psych eval done last night which recommended inpatient placement.  Plan  Current plan is for inpatient placement.  Pt is under IVC.    Jacalyn Lefevre, MD 03/31/22 [phone]

## 2022-03-31 NOTE — ED Notes (Signed)
When RN arrived at 1900, pt was no longer restrained. Resting in stretcher with no distress noted. Sitter at bedside.

## 2022-04-01 ENCOUNTER — Encounter (HOSPITAL_COMMUNITY): Payer: Self-pay

## 2022-04-01 ENCOUNTER — Other Ambulatory Visit: Payer: Self-pay

## 2022-04-01 MED ORDER — ZIPRASIDONE MESYLATE 20 MG IM SOLR
INTRAMUSCULAR | Status: AC
  Administered 2022-04-01: 20 mg via INTRAMUSCULAR
  Filled 2022-04-01: qty 20

## 2022-04-01 MED ORDER — STERILE WATER FOR INJECTION IJ SOLN
INTRAMUSCULAR | Status: AC
  Administered 2022-04-01: 1.2 mL
  Filled 2022-04-01: qty 10

## 2022-04-01 MED ORDER — OLANZAPINE 10 MG PO TBDP
10.0000 mg | ORAL_TABLET | Freq: Two times a day (BID) | ORAL | Status: DC
  Administered 2022-04-02 – 2022-04-11 (×14): 10 mg via ORAL
  Filled 2022-04-01 (×15): qty 1

## 2022-04-01 NOTE — ED Notes (Signed)
Verbally aggressive and threatening staff and patients as they walk by. Refuses PO medication.
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
## 2022-04-01 NOTE — ED Notes (Signed)
Pt cooperative w/ obtaining VS. Pt agreeable w/ emptying colostomy bag. Pt ambulatory w/o assist.

## 2022-04-01 NOTE — ED Notes (Signed)
Gave pt another  breakfast [place] sandwich and warm blanket

## 2022-04-01 NOTE — ED Notes (Signed)
Pt was given a strawberry ice and some saltine crackers.

## 2022-04-01 NOTE — ED Notes (Signed)
Pt sleeping att, will hold off on VS

## 2022-04-01 NOTE — ED Provider Notes (Signed)
Emergency Medicine Observation Re-evaluation Note  Dalton Moore is a 26 y.o. male, seen on rounds today.  Pt initially presented to the ED for complaints of Psychiatric Evaluation Currently, the patient is IVC awaiting psych placement.  Physical Exam  BP 114/64 (BP Location: Left Arm)   Pulse (!) 58   Temp 97.9 F (36.6 C) (Oral)   Resp 16   SpO2 99%  Physical Exam Patient is alert in no acute distress ED Course / MDM  EKG:EKG Interpretation  Date/Time:  Sunday March 30 2022 11:27:25 EDT Ventricular Rate:  76 PR Interval:  142 QRS Duration: 103 QT Interval:  414 QTC Calculation: 466 R Axis:   39 Text Interpretation: Sinus rhythm Probable left atrial enlargement RSR' in V1 or V2, probably normal variant ST elev, probable normal early repol pattern agree, no old comparison Confirmed by Arby Barrette [phone]) on 03/30/2022 3:45:00 PM  I have reviewed the labs performed to date as well as medications administered while in observation.  Recent changes in the last 24 hours include none.  Plan  Current plan is for psych placement.  Patient IVC.    Bethann Berkshire, MD 04/01/22 [phone]

## 2022-04-01 NOTE — ED Notes (Signed)
Patient is currently sleeping in bed.

## 2022-04-01 NOTE — ED Notes (Addendum)
Pt was given a breakfast [place] sandwich and some def coffee

## 2022-04-01 NOTE — Progress Notes (Signed)
Inpatient Behavioral Health Placement  Pt meets inpatient criteria per Earney Navy, NP. There are no available beds at [facility] Referral was sent to the following facilities;   Destination Service Provider Address Phone Fax  [facility]  [address] [place] [address] [phone]  [facility]  [address] [place], [place] [address] [phone]  [facility]  [address] [place] [address] [phone]  [facility]  [address] [place], [place] [address] [phone]  [facility]  [address] [place] [address] [phone]  [facility]  [address] [place] [address] [phone]  [facility]  [address] [place] [address] [phone]  [facility]  [address] [place] [address] [phone]  [facility]  [address] [place] [address] [phone]  [facility]  [address], [place] [address] [phone]  [facility]  [address] [place] [address] [phone]  [facility]  [address] [place] [address] [phone]  [facility]  [address] [place] [address] [phone]  [facility]  [address] [place] [address] [phone]  [facility]  [address], [place] [address] [phone]  [facility]  [address] [place] [address] [phone]    Situation ongoing,  CSW will follow up.   Maryjean Ka, MSW, [facility] 04/01/2022  @ 11:57  PM

## 2022-04-01 NOTE — ED Notes (Signed)
Patient asked for a sandwich and was provided one.Patient hen asked why he was here and how long he would be here. I advised he would be here until he had a bed available at another hospital. Patient also was advised that he was IVC'd. Patient began using explicit language but was redirectable at his time. He is now in bed.

## 2022-04-01 NOTE — ED Notes (Signed)
Patients ostomy is leaking NT made aware, waiting for assistance and supplies

## 2022-04-01 NOTE — Progress Notes (Addendum)
This CSW contacted [facility] to connect this pt to th ACT Team. Shelly Coss provided this CSW with the following resources:   [facility]: Called and sent an email to request a referral form.  [facility]: Left a voicemail. [facility]: Currently not open.   [facility] requires the pt to have a contact number. None are listed in the chart. Per Julieanne Cotton, NP, the pt is continuing to meet inpatient psychiatric hospitalization. TOC following and will speak with the pt when he is stabilized.

## 2022-04-02 MED ORDER — ZIPRASIDONE MESYLATE 20 MG IM SOLR: 20.0000 mg | Freq: Once | INTRAMUSCULAR | Status: AC

## 2022-04-02 MED ORDER — HALOPERIDOL LACTATE 5 MG/ML IJ SOLN
5.0000 mg | Freq: Once | INTRAMUSCULAR | Status: AC
  Administered 2022-04-02: 5 mg via INTRAMUSCULAR
  Filled 2022-04-02: qty 1

## 2022-04-02 MED ORDER — STERILE WATER FOR INJECTION IJ SOLN
INTRAMUSCULAR | Status: AC
  Administered 2022-04-02: 1.2 mL via INTRAMUSCULAR
  Filled 2022-04-02: qty 10

## 2022-04-02 MED ORDER — LORAZEPAM 2 MG/ML IJ SOLN
2.0000 mg | Freq: Four times a day (QID) | INTRAMUSCULAR | Status: DC | PRN
  Administered 2022-04-02 – 2022-04-07 (×5): 2 mg via INTRAMUSCULAR
  Filled 2022-04-02 (×5): qty 1

## 2022-04-02 MED ORDER — ZIPRASIDONE MESYLATE 20 MG IM SOLR
INTRAMUSCULAR | Status: AC
  Administered 2022-04-02: 20 mg via INTRAMUSCULAR
  Filled 2022-04-02: qty 20

## 2022-04-02 MED ORDER — HALOPERIDOL LACTATE 5 MG/ML IJ SOLN
5.0000 mg | Freq: Four times a day (QID) | INTRAMUSCULAR | Status: DC | PRN
  Administered 2022-04-02 – 2022-04-07 (×4): 5 mg via INTRAMUSCULAR
  Filled 2022-04-02 (×4): qty 1

## 2022-04-02 NOTE — Progress Notes (Signed)
Pt offered breakfast tray. Pt refused and wants to sleep.

## 2022-04-02 NOTE — ED Notes (Signed)
Pt is awake and  alert. Pt delusional with and using violent language. Stating he is going to throw his water and meal tray in the floor if provided. Pt stating, "when I get my own house, I am not going to talk to anybody, drink alcohol, and smoke weed all day." Pt is cooperative at this time lying in bed.
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
## 2022-04-02 NOTE — Progress Notes (Signed)
RN and NT able to obtain vitals. Restraints removed at this time and boundaries established with pt regarding behavior to remain out of restraints . Pt was offered a full linen change, which he refused despite being visibly soiled. Pt amenable to fresh blankets which were provided. RN asked several times if pt would like colostomy bag, which pt also refused. Pt is naked, soiled with stool, and refuses all hygiene measures or scrubs. Pt shortly after rolled over and is currently asleep. Sitter remains at bedside.

## 2022-04-02 NOTE — Progress Notes (Signed)
Pt verbally threatening to kill this NT. Pt reminded threatening language would not be tolerated. RN aware.

## 2022-04-02 NOTE — ED Notes (Addendum)
Pt using profanity staff and got very verbal aggressive threatening to hit me, so the nurse came on seen also security to get him to calm down which he did not until he was put in 4 points restraints plus given an shot.

## 2022-04-02 NOTE — ED Notes (Signed)
Pt awake and cooperative. Dinner tray given.
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
## 2022-04-02 NOTE — ED Notes (Addendum)
Restraints discontinued at this time. Patient agrees to stay in the bed. Patient states he wants to sleep. Patient verbalizes understanding of requirements to remain out of restraints. Provided with warm blanket upon request. He is resting with eyes closed at this time. Equal chest rise and fall noted. Sitter remains at bedside. Offered to apply colostomy bag to site of colostomy and patient refuses.

## 2022-04-02 NOTE — ED Provider Notes (Signed)
Emergency Medicine Observation Re-evaluation Note  Dalton Moore is a 26 y.o. male, seen on rounds today.  Pt initially presented to the ED for complaints of Psychiatric Evaluation Currently, the patient is awaiting placement for psych.  He is IVC  Physical Exam  BP (!) 146/79 (BP Location: Left Arm)   Pulse 72   Temp 98.7 F (37.1 C) (Oral)   Resp 15   Ht 6\' 1"  (1.854 m)   Wt 72.6 kg   SpO2 100%   BMI 21.11 kg/m  Physical Exam Patient is alert in no acute distress ED Course / MDM  EKG:EKG Interpretation  Date/Time:  Sunday March 30 2022 11:27:25 EDT Ventricular Rate:  76 PR Interval:  142 QRS Duration: 103 QT Interval:  414 QTC Calculation: 466 R Axis:   39 Text Interpretation: Sinus rhythm Probable left atrial enlargement RSR' in V1 or V2, probably normal variant ST elev, probable normal early repol pattern agree, no old comparison Confirmed by 1990 [phone]) on 03/30/2022 3:45:00 PM  I have reviewed the labs performed to date as well as medications administered while in observation.  Recent changes in the last 24 hours include none  Plan  Current plan is for psych placement.  Patient is IVC    05/30/2022, MD 04/02/22 0730

## 2022-04-02 NOTE — Progress Notes (Signed)
Offered pt a shower. Pt agreed and taken back to TCU. Pt showered, linens changed, and new colostomy bag applied. Pt currently eating graham crackers and peanut butter. Mood much improved from earlier interactions.

## 2022-04-02 NOTE — Progress Notes (Signed)
Pt awoke suddenly and asked for breakfast. Breakfast provided for pt which he promptly threw on the floor.
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
## 2022-04-02 NOTE — ED Notes (Addendum)
Patient alert and calm while obtaining vital signs. He remains in restraints at this time. Sitter at bedside.

## 2022-04-02 NOTE — ED Notes (Signed)
Following haldol administration, pt is more appropriate, calm, and cooperative. Sitter took pt for shower. Pt sitting up eating snack. Pt provided with colostomy bag and pt self applied.

## 2022-04-02 NOTE — ED Notes (Signed)
Pt not in restraints at the time I assumed care. Pt resting with eyes closed. Even, unlabored resp. Sitter at bedside.

## 2022-04-02 NOTE — Progress Notes (Signed)
Patient has been denied by [facility] due to no appropriate beds available. Patient meets BH inpatient criteria per Hillery Jacks, NP. Patient has been faxed out to the following facilities:    [facility]  [address] [place] [address] [phone]  [facility]  [address] [place], [place] [address] [phone]  [facility]  [address] [place] [address] [phone]  [facility]  [address] [place], [place] [address] [phone]  [facility]  [address] [place] [address] [phone]  [facility]  [address] [place] [address] [phone]  [facility]  [address], [place] [address] [phone]  [facility]  [address] [place] [address] [phone]  [facility]  [address] [place] [address] [phone]  [facility]  [address], [place] [address] [phone]  [facility]  [address] [place] [address] [phone]  [facility]  [address], [place] [address] [phone]  [facility]  [address] [place] [address] [phone]  [facility]  [address] [place] [address] [phone]  [facility]  [address] [place] [address] [phone]  [facility]  [address] [place] [address] [phone]   Damita Dunnings, MSW, LCSW-A  9:38 PM 04/02/2022

## 2022-04-02 NOTE — Progress Notes (Signed)
Lunch tray provided to pt. Pt refused stating we poisoned the food. Pt continues verbal threats, harassing staff and patients as they walk by, and using foul language. Sitter remains bedside.
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
## 2022-04-03 NOTE — ED Notes (Signed)
Patient is IVC'd by police, Dr. Donnald Garre did first exam,expires on 04/06/22.

## 2022-04-03 NOTE — ED Notes (Signed)
I will update pt vitals once he is awake due to pass behavior.
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
## 2022-04-03 NOTE — ED Notes (Signed)
Patient to room 27.  Patient oriented to unit and room.  Patient calm, cooperative,  and no s/s of distress.   No suicidal ideation noted. No homicidal ideation noted.

## 2022-04-04 NOTE — Progress Notes (Signed)
Patient has been denied by [facility] due to no appropriate beds available. Patient meets BH inpatient criteria per Hillery Jacks, NP. Patient has been faxed out to the following facilities:    [facility]  [address] [place] [address] [phone]  [facility]  [address] [place], [place] [address] [phone]  [facility]  [address] [place] [address] [phone]  [facility]  [address] [place], [place] [address] [phone]  [facility]  [address] [place] [address] [phone]  [facility]  [address] [place] [address] [phone]  [facility]  [address] [place] [address] [phone]  [facility]  [address] [place] [address] [phone]  [facility]  [address], [place] [address] [phone]  [facility]  [address] [place] [address] [phone]  [facility]  [address] [place] [address] [phone]  [facility]  [address], [place] [address] [phone]  [facility]  [address] [place] [address] [phone]  [facility]  [address] [place] [address] [phone]  [facility]  [address] [place] [address] [phone]  [facility]  [address], [place] [address] [phone]  [facility]  [address] [place] [address] [phone]  [facility]  [address], [place] [address] [phone]  [facility]  [address] [place] [address] [phone]   Damita Dunnings, MSW, LCSW-A  8:44 PM 04/04/2022

## 2022-04-04 NOTE — Progress Notes (Signed)
APS worker contacted this CSW. This CSW informed him that Per Nira Conn, NP, he pt is still meeting criteria for inpatient psych. This CSW provided him with Staci Acosta, LCSW, contact number for updates. TOC following.

## 2022-04-04 NOTE — Progress Notes (Signed)
Inpatient Behavioral Health Placement  Pt meets inpatient criteria per Nira Conn, NP. There are no available beds at [facility].  Referral was sent to the following facilities;   Destination Service Provider Address Phone Fax  [facility]  [address] [place] [address] [phone]  [facility]  [address] [place], [place] [address] [phone]  [facility]  [address] [place] [address] [phone]  [facility]  [address] [place], [place] [address] [phone]  [facility]  [address] [place] [address] [phone]  [facility]  [address] [place] [address] [phone]  [facility]  [address], [place] [address] [phone]  [facility]  [address] [place] [address] [phone]  [facility]  [address] [place] [address] [phone]  [facility]  [address], [place] [address] [phone]  [facility]  [address], [place] [address] [phone]  [facility]  [address] [place] [address] [phone]  [facility]  [address] [place] [address] [phone]  [facility]  [address] [place] [address] [phone]  [facility]  [address], [place] [address] [phone]  [facility]  [address] [place] [address] [phone]  [facility]  [address] [place] [address] [phone]   [facility]  [address] [place] [address] [phone]  [facility]  [address] [place] [address] [phone]    Situation ongoing,  CSW will follow up.   Maryjean Ka, MSW, LCSWA 04/04/2022  @ 12:44 AM
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
## 2022-04-04 NOTE — ED Provider Notes (Signed)
Emergency Medicine Observation Re-evaluation Note  Dalton Moore is a 26 y.o. male, seen on rounds today.  Pt initially presented to the ED for complaints of Psychiatric Evaluation Currently, the patient is sleeping.  Physical Exam  BP 120/60 (BP Location: Left Arm)   Pulse 79   Temp 98.4 F (36.9 C) (Oral)   Resp 18   Ht 1.854 m (6\' 1" )   Wt 72.6 kg   SpO2 98%   BMI 21.11 kg/m  Physical Exam   ED Course / MDM  EKG:EKG Interpretation  Date/Time:  Sunday March 30 2022 11:27:25 EDT Ventricular Rate:  76 PR Interval:  142 QRS Duration: 103 QT Interval:  414 QTC Calculation: 466 R Axis:   39 Text Interpretation: Sinus rhythm Probable left atrial enlargement RSR' in V1 or V2, probably normal variant ST elev, probable normal early repol pattern agree, no old comparison Confirmed by 06[birth year]04[birth year]1989 [phone]) on 03/30/2022 3:45:00 PM  I have reviewed the labs performed to date as well as medications administered while in observation.  Recent changes in the last 24 hours include none.  Plan  Current plan is for .  Placement    05/30/2022, MD 04/04/22 [phone]

## 2022-04-04 NOTE — ED Notes (Signed)
Patient has been cooperative this shift.  Patient states wants help with the "demons that walk beside him". Patient wants help with the dreams he is having.

## 2022-04-05 DIAGNOSIS — F209 Schizophrenia, unspecified: Secondary | ICD-10-CM | POA: Diagnosis not present

## 2022-04-05 MED ORDER — ONDANSETRON 4 MG PO TBDP
4.0000 mg | ORAL_TABLET | Freq: Once | ORAL | Status: AC
  Administered 2022-04-05: 4 mg via ORAL
  Filled 2022-04-05: qty 1
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
## 2022-04-05 NOTE — Progress Notes (Signed)
Per Hillery Jacks, NP, patient meets criteria for inpatient treatment. There are no available beds at [facility] today. CSW re-faxed referrals to the following facilities for review:  [facility]  Pending - Request Sent N/A [address] [place] [address] [phone] --  [facility]  Pending - Request Sent N/A [address] [place], [place] [address] [phone] --  [facility]  Pending - Request Sent N/A [address] [place] [address] [phone] --  [facility]  Pending - Request Sent N/A [address] [place], [place] [address] [phone] --  [facility]  Pending - Request Sent N/A [address] [place] [address] [phone] --  [facility]  Pending - Request Sent N/A [address], [place] [address] [phone] --  [facility]  Pending - Request Sent N/A [address] [place] [address] [phone] --  [facility]  Pending - Request Sent N/A [address] [place] [address] [phone] --  [facility]  Pending - Request Sent N/A [address] [place] [address] [phone] --  [facility]  Pending - Request Sent N/A [address] [place] [address] [phone] --  [facility]  Pending - Request Sent N/A [address] [place] [address] [phone] --  [facility]  Pending - Request Sent N/A [address] [place] [address] [phone] --  [facility]  Pending - Request Sent N/A [address] [place] [address] [phone] --  [facility]  Pending - Request Sent N/A [address] [place] [address] [phone] --  [facility]  Pending - Request Sent N/A [address], [place] [address] [phone] --  [facility]  Pending - Request Sent N/A [address] [place], [place] [address] [phone] --  [facility]  Pending - Request Sent N/A [address], [place] [address] [phone] --  [facility]  Pending - Request Sent N/A [address], [place] [address] [phone] --  [facility]  Pending - Request Sent N/A [address] [place] [address] [phone] --  [facility]  Pending - No Request Sent N/A [address] [place] [address] [phone] --  [facility]  Pending - No Request Sent N/A [address] [place] [address] [phone] --  [facility]  Pending - No Request Sent N/A [address] [place] [address] [phone] --  [facility]  Pending - No Request Sent N/A [address] [place], [place] [address] [phone] --  [facility]  Pending - No Request Sent N/A [address], [place] [address] [phone] --    TTS will continue to seek bed placement.  Crissie Reese, MSW, Lenice Pressman Phone: [phone] Disposition/TOC

## 2022-04-05 NOTE — ED Notes (Signed)
Pt has been calm for the shift, with no incidents.

## 2022-04-05 NOTE — ED Notes (Signed)
Pt c/o mild nausea, provider contacted, orders for Zofran placed

## 2022-04-05 NOTE — ED Notes (Addendum)
Pt awake and took a shower d/t I bag having come off in the night and fecal matter on the bed. New bag applied, vitals taken, and AM meds taken without incident. Pt calm and cooperative.

## 2022-04-06 NOTE — ED Notes (Signed)
Patient has a renewed and complete IVC papers by Nira Conn, NP, Cert. Exam. On 04/06/22 and will expire on 04/13/22.

## 2022-04-07 MED ORDER — ONDANSETRON 4 MG PO TBDP
ORAL_TABLET | ORAL | Status: AC
  Administered 2022-04-07: 4 mg
  Filled 2022-04-07: qty 1

## 2022-04-07 NOTE — ED Notes (Signed)
Pt to door notably upset. Pt appears to have had a vivid nightmare that woke him up. Pt tearful and agitated talking about the spirit in his room that was making it cold.Pt also speaking intermittently in a garbled manner as if trying to speak a foreign language. Pt is agreeable to a shot for agitation at this time.

## 2022-04-07 NOTE — ED Notes (Addendum)
Pt refused vitals. Stated, " My stomach hurt and I don't feel like moving." Writer will attempt to take vitals at another time.

## 2022-04-07 NOTE — BH Assessment (Addendum)
Per Caryn Bee, NP, continue with current inpatient recommendations at this time.   Per [facility] Selena Batten, RN) no appropriate beds available.  Disposition Counselor referred patient to the following hospitals for consideration of bed placement.   Destination Service Provider Request Status Selected Services Address Phone Fax Patient Preferred  [facility]  Pending - Request Sent N/A [address], [place] [address] [phone] --  [facility]  Pending - Request Sent N/A [address] [place], [place] [address] [phone] --  [facility]  Pending - Request Sent N/A [address] [place] [address] [phone] --  [facility]  Pending - Request Sent N/A [address] [place], [place] [address] [phone] --  [facility]  Pending - Request Sent N/A [address] [place] [address] [phone] --  [facility]  Pending - Request Sent N/A [address], [place] [address] [phone] --  [facility]  Pending - Request Sent N/A [address] [place] [address] [phone] --  [facility]  Pending - Request Sent N/A [address] [place] [address] [phone] --  [facility]  Pending - Request Sent N/A [address] [place] [address] [phone] --  [facility]  Pending - Request Sent N/A [address], [place] [address] [phone] --  [facility]  Pending - Request Sent N/A [address] [place] [address] [phone] --  [facility]  Pending - Request Sent N/A [address] [place] [address] [phone] --   [facility]  Pending - Request Sent N/A [address] [place] [address] [phone] --  [facility]  Pending - Request Sent N/A [address] [place] [address] [phone] --  [facility]  Pending - Request Sent N/A [address] [place] [address] [phone] --  [facility]  Pending - Request Sent N/A [address] [place], [place] [address] [phone] --  [facility]  Pending - Request Sent N/A [address], [place] [address] [phone] --  [facility]  Pending - Request Sent N/A [address] [place] [address] [phone] --  [facility]  Pending - Request Sent N/A [address] [place] [address] [phone] --  [facility]  Pending - Request Sent N/A [address] [place] [address] [phone] --  [facility]  Pending - Request Sent N/A [address], [place] [address] [phone] --  [facility]  Pending - Request Sent N/A [address] [place] [address] [phone] --  [facility]  Pending - Request Sent N/A [address] [place], [place] [address] [phone] --  [facility]  Pending - Request Sent N/A [address] [place] [address] [phone] --

## 2022-04-07 NOTE — ED Notes (Signed)
Patient has been alert this shift.  Patient has been resting comfortably. Patient medication compliant.  Guarded .  Quiet.  No suicidal ideation noted. No homicidal ideation noted.

## 2022-04-07 NOTE — BH Assessment (Addendum)
Per Malachy Chamber, NP, patient continues to meet criteria for inpatient psychiatric treatment. Per Delaney Meigs, patient accepted to [facility] for admission 04/09/2022 (after 9 am). The accepting provider is Estill Cotta, MD. Patient to present to the main campus upon arrival. Nurse report #[phone]. Patient's nurse @ [name] Morrell Riddle, RN) provided disposition updates.

## 2022-04-07 NOTE — Consult Note (Signed)
  This psychiatric provider attempted to reassess patient.  Writer introduced self and reason for today's visit.  Patient respectfully declined further interview with psychiatric provider stating " every time I say something my words get turned around and they are used against me.  That is how I am here now.  I respectfully declined further interview.  Please leave."   Psychiatric reassessment was terminated at that time.  Patient encouraged in the event he changes his mind and wishes to speak with psychiatric provider, please notify nursing staff will contact provider.  -Will continue to follow.  -Continue with current inpatient recommendations at this time
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
## 2022-04-08 NOTE — ED Notes (Signed)
Patient was cooperative and took his medications. He slept throughout the night and only woke up once.

## 2022-04-08 NOTE — ED Provider Notes (Signed)
Emergency Medicine Observation Re-evaluation Note  Dalton Moore is a 26 y.o. male, seen on rounds today.  Pt initially presented to the ED for complaints of Psychiatric Evaluation Currently, the patient is resting.  Physical Exam  BP 134/81 (BP Location: Right Arm)   Pulse 87   Temp (!) 97.3 F (36.3 C) (Oral)   Resp 18   Ht 1.854 m (6\' 1" )   Wt 72.6 kg   SpO2 97%   BMI 21.11 kg/m  Physical Exam   ED Course / MDM  EKG:EKG Interpretation  Date/Time:  Sunday March 30 2022 11:27:25 EDT Ventricular Rate:  76 PR Interval:  142 QRS Duration: 103 QT Interval:  414 QTC Calculation: 466 R Axis:   39 Text Interpretation: Sinus rhythm Probable left atrial enlargement RSR' in V1 or V2, probably normal variant ST elev, probable normal early repol pattern agree, no old comparison Confirmed by 06-04-1990 [phone]) on 03/30/2022 3:45:00 PM  I have reviewed the labs performed to date as well as medications administered while in observation.  Recent changes in the last 24 hours include nothing.  Plan  [facility].    [facility], MD 04/08/22 1306

## 2022-04-09 ENCOUNTER — Inpatient Hospital Stay: Admission: RE | Admit: 2022-04-09 | Payer: Medicaid Other | Source: Intra-hospital | Admitting: Psychiatry

## 2022-04-09 DIAGNOSIS — R462 Strange and inexplicable behavior: Secondary | ICD-10-CM | POA: Diagnosis not present

## 2022-04-09 DIAGNOSIS — F209 Schizophrenia, unspecified: Secondary | ICD-10-CM | POA: Diagnosis not present

## 2022-04-09 DIAGNOSIS — F4310 Post-traumatic stress disorder, unspecified: Secondary | ICD-10-CM | POA: Diagnosis not present

## 2022-04-09 LAB — SARS CORONAVIRUS 2 BY RT PCR: SARS Coronavirus 2 by RT PCR: POSITIVE — AB
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
## 2022-04-09 NOTE — ED Provider Notes (Signed)
Emergency Medicine Observation Re-evaluation Note  Dalton Moore is a 26 y.o. male, seen on rounds today.  Pt initially presented to the ED for complaints of Psychiatric Evaluation Currently, the patient is sleeping.  Physical Exam  BP 134/81 (BP Location: Right Arm)   Pulse 87   Temp (!) 97.3 F (36.3 C) (Oral)   Resp 18   Ht 6\' 1"  (1.854 m)   Wt 72.6 kg   SpO2 97%   BMI 21.11 kg/m  Physical Exam General: sleeping Cardiac: rrr Lungs: no inc WOB Psych: calm  ED Course / MDM  EKG:EKG Interpretation  Date/Time:  Sunday March 30 2022 11:27:25 EDT Ventricular Rate:  76 PR Interval:  142 QRS Duration: 103 QT Interval:  414 QTC Calculation: 466 R Axis:   39 Text Interpretation: Sinus rhythm Probable left atrial enlargement RSR' in V1 or V2, probably normal variant ST elev, probable normal early repol pattern agree, no old comparison Confirmed by [birth year]1989 [phone]) on 03/30/2022 3:45:00 PM  I have reviewed the labs performed to date as well as medications administered while in observation.  Recent changes in the last 24 hours include none.  Plan  Current plan is for placement.    05/30/2022, MD 04/09/22 0930

## 2022-04-09 NOTE — BH Assessment (Addendum)
Patient is to be admitted to [facility] BMU tonight 04/09/22 after 7:30pm by Dr. Toni Amend.  Attending Physician will be Dr.  Toni Amend .   Patient has been assigned to room 322, by [facility] Charge Nurse, Maryelizabeth Kaufmann.   ** Call for report to: [phone] **    ER staff is aware of the admission:  Sue Lush, Patient Access.

## 2022-04-09 NOTE — Progress Notes (Signed)
Per Dr. Toni Amend pt has been accepted to [facility]  Patient is to be admitted to [facility] BMU tonight 04/09/22 after 7:30pm by Dr. Toni Amend.  Attending Physician will be Dr.  Toni Amend .   Patient has been assigned to room 322, by [facility] Charge Nurse, Maryelizabeth Kaufmann.    ** Call for report to: [phone] **    ER staff is aware of the admission:   Sue Lush, Patient Access. Care Team notified: Mordecai Rasmussen, MD, Nira Conn, NP, Lum Babe, RN, Gilford Rile, Counselor, Damita Dunnings, [place], and Tia Alert, RN  Maryjean Ka, MSW, [facility], [place] 04/09/2022 4:32 PM

## 2022-04-09 NOTE — ED Notes (Signed)
This Clinical research associate attempted to call report to [facility] at [facility], while on the phone patients COVID test resulted and was positive. Informed by her that they could not take patient now dt COVID positive.JRPRN

## 2022-04-09 NOTE — ED Notes (Signed)
Patient has been been cooperative this shift. Patient medication compliant. Patient has been sleeping this shift.

## 2022-04-09 NOTE — ED Notes (Signed)
This nurse informed Nira Conn, NP and Dr. Lynelle Doctor that patient could not be transferred to [facility] now dt COVID positive. JRPRN

## 2022-04-10 DIAGNOSIS — F209 Schizophrenia, unspecified: Secondary | ICD-10-CM | POA: Diagnosis not present

## 2022-04-10 DIAGNOSIS — F4310 Post-traumatic stress disorder, unspecified: Secondary | ICD-10-CM | POA: Diagnosis not present

## 2022-04-10 DIAGNOSIS — R462 Strange and inexplicable behavior: Secondary | ICD-10-CM | POA: Diagnosis not present

## 2022-04-10 NOTE — ED Provider Notes (Signed)
Emergency Medicine Observation Re-evaluation Note  Dalton Moore is a 26 y.o. male, seen on rounds today.  Pt initially presented to the ED for complaints of Psychiatric Evaluation Currently, the patient is resting quietly. C/o abdominal pain but states it always hurts and declines tylenol.  Physical Exam  BP 108/65 (BP Location: Right Arm)   Pulse 89   Temp (!) 97.5 F (36.4 C) (Oral)   Resp 18   Ht 6\' 1"  (1.854 m)   Wt 72.6 kg   SpO2 98%   BMI 21.11 kg/m  Physical Exam General: Resting quietly Cardiac: Well-perfused Lungs: No increased work of breathing Psych: Calm cooperative  ED Course / MDM  EKG:EKG Interpretation  Date/Time:  Sunday March 30 2022 11:27:25 EDT Ventricular Rate:  76 PR Interval:  142 QRS Duration: 103 QT Interval:  414 QTC Calculation: 466 R Axis:   39 Text Interpretation: Sinus rhythm Probable left atrial enlargement RSR' in V1 or V2, probably normal variant ST elev, probable normal early repol pattern agree, no old comparison Confirmed by 06-04-1990 [phone]) on 03/30/2022 3:45:00 PM  I have reviewed the labs performed to date as well as medications administered while in observation.  Recent changes in the last 24 hours include supposed to have been admitted to [facility] BMU yesterday evening, but COVID test resulted positive.   Plan  Current plan is for inpatient placement.    [facility], MD 04/10/22 [phone]
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
## 2022-04-10 NOTE — ED Notes (Signed)
Patient calm and cooperative today. Changed colostomy bag twice on own. AAO.

## 2022-04-11 ENCOUNTER — Other Ambulatory Visit (HOSPITAL_COMMUNITY): Payer: Self-pay

## 2022-04-11 DIAGNOSIS — R462 Strange and inexplicable behavior: Secondary | ICD-10-CM | POA: Diagnosis not present

## 2022-04-11 DIAGNOSIS — F209 Schizophrenia, unspecified: Secondary | ICD-10-CM | POA: Diagnosis not present

## 2022-04-11 DIAGNOSIS — F4310 Post-traumatic stress disorder, unspecified: Secondary | ICD-10-CM

## 2022-04-11 MED ORDER — OLANZAPINE 10 MG PO TABS
10.0000 mg | ORAL_TABLET | Freq: Two times a day (BID) | ORAL | 1 refills | Status: DC
  Filled 2022-04-11 (×2): qty 60, 30d supply, fill #0

## 2022-04-11 MED ORDER — OLANZAPINE 10 MG PO TABS
10.0000 mg | ORAL_TABLET | Freq: Two times a day (BID) | ORAL | 1 refills | Status: DC
  Filled 2022-04-11: qty 60, 30d supply, fill #0

## 2022-04-11 NOTE — ED Notes (Signed)
Pt received breakfast tray 

## 2022-04-11 NOTE — Progress Notes (Signed)
CM spoke with [facility] hospital liaison Jena Gauss who provide the following information:  Patient is receiving care coordination services from [facility], the assigned care coordinator is Carlynn Spry [phone].  CM called Carlynn Spry and left a secure HIPAA compliant VM requesting a call back.

## 2022-04-11 NOTE — Progress Notes (Addendum)
CM spoke with APS worker Dorene Sorrow U. Who requested notes reflecting capacity findings.  Notes were sent securely. TOC pharmacy at [facility] to courier prescribed medications to [facility] per [facility] NP request.  Spoke with CC Carlynn Spry and provided contact information for patient.  Requested that CC contact patient for community support post discharge.  Fayrene Fearing reports he will speak with patient after completing call with CM.

## 2022-04-11 NOTE — ED Notes (Signed)
Patient resting comfortably

## 2022-04-11 NOTE — ED Notes (Signed)
Patient discharged off unit to home per provider. Patient alert, cooperative, no s/s of distress.  Discharge information given to patient. Belongings given to patient. Patient ambulatory off unit, escorted by RN. Patient given bus pass for transportation.

## 2022-04-11 NOTE — ED Notes (Signed)
Pt received lunch tray 

## 2022-04-11 NOTE — Progress Notes (Signed)
VM left for [facility] hospital liaison requesting call back regarding care coordination services for patient through [facility].

## 2022-04-11 NOTE — Discharge Instructions (Addendum)
Discharge recommendations:  Patient is to take medications as prescribed. Please see information for follow-up appointment with psychiatry and therapy. Please follow up with your primary care provider for all medical related needs.   Therapy: We recommend that patient participate in individual therapy to address mental health concerns.  Medications: The patient is to contact a medical professional and/or outpatient provider to address any new side effects that develop. Patient should update outpatient providers of any new medications and/or medication changes.   Atypical antipsychotics: If you are prescribed an atypical antipsychotic, it is recommended that your height, weight, BMI, blood pressure, fasting lipid panel, and fasting blood sugar be monitored by your outpatient providers.  Safety:  The patient should abstain from use of illicit substances/drugs and abuse of any medications. If symptoms worsen or do not continue to improve or if the patient becomes actively suicidal or homicidal then it is recommended that the patient return to the closest hospital emergency department, the [facility], or call 911 for further evaluation and treatment. National Suicide Prevention Lifeline 1-800-SUICIDE or (336)699-8096.  About 988 988 offers 24/7 access to trained crisis counselors who can help people experiencing mental health-related distress. People can call or text 988 or chat 988lifeline.org for themselves or if they are worried about a loved one who may need crisis support.  Crisis Mobile: [facility]:                     301-032-1936 (for crisis response 24 hours a day) [facility] Hotline:                                            (828) 675-0246   ================================================================ DAY CENTERS [facility]) M-F 8am-3pm   [address] [place], [place] [address]   [phone] Services  include: laundry, barbering, support groups, case management, phone  & computer access, showers, AA/NA mtgs, mental health/substance abuse nurse, job skills class, disability information, VA assistance, spiritual classes, etc.  HOMELESS SHELTERS   [facility]                              [place]    [address] [place]                                   [phone]                                                                                                                                        [employer] (women and children)  [address] [place], [place] [address] [phone] [email or web address] for application and process Application Required   [facility] [employer] [facility]                [address]                                 [place] [address]                                       [phone]                                                                                                                                                                                                     [facility] [place] [address] [place], [place] [address] [phone] application appt.) Application Required   [facility] (women only)                           [address]                                       [place], [place] [address]                                      [phone]                                                   Intake starts 6pm daily Need valid ID, SSC, & Police report [employer] [address] [place], [place] [phone] Application Required   [employer] (men only)                                   [address]  [place], [place]                                         [phone]                                                     [facility]  [place] (Pregnant women only) [address] [place], [place] [phone]   [facility]                                                   [address]                                                 [place], [place] [address]                               [phone]                                                                                                                            [place] [facility] [address] [place], [place] [phone] 90 day commitment/SA/Application process   [facility](men only)                                               [address]                                         [place], [place]                                         [phone]                                                               Check-in at 7pm                                                                                                                          [facility] [address] [place], [place] [address] [phone] Men/Women/Women and Children must be there by 7 pm   [facility] [place], [place] [phone]

## 2022-04-11 NOTE — Progress Notes (Signed)
This CSW spoke with Dorene Sorrow, APS worker, who informed that he will not be visiting the pt today due to the pt testing positive for Covid-19.

## 2022-04-11 NOTE — Consult Note (Addendum)
Telepsych Consultation   Reason for Consult: Capacity Referring Physician:  Zenia Resides Location of Patient:  Location of Provider: Other: Emergency department  Patient Identification: Dalton Moore MRN:  [id number] Principal Diagnosis: Schizophrenia ([place]) Diagnosis:  Principal Problem:   Schizophrenia ([place]) Active Problems:   PTSD (post-traumatic stress disorder)   Bizarre behavior   Total Time spent with patient: 20 minutes  Subjective:   Dalton Moore is a 26 y.o. male patient admitted with a history of schizophrenia who presented with bizarre behavior and endorsing ideation in the context being noncompliant with psychiatric medication and using Xanax and marijuana.  HPI:  Patient presented to the emergency department on 03/30/2022.  Psychiatric inpatient admission was recommended, however patient tested positive for COVID and has been under quarantine.  In the interim, patient was treated with appropriate psychotropic medication and is now psychiatrically clear.  This assessment was completed for evaluation of capacity.  On interview today the patient was able to describe that he has a mental health diagnosis of "manic depression".  He states he is to be on medications as prescribed by his doctor so that "I do not go crazy".  He states that when he does not take his medication, that he "acts like the baboon".  Patient states that he intends to live with his grandmother, Jetty Duhamel after discharge.  He states that his uncle was able to pick him up.  He stated that his grandmother can help ensure that he takes his medications.  Discussed with patient how substance use can worsen his mental status and contribute to psychosis.  The patient states that he has not used marijuana in 1 week is able to verbalize understanding that he should not use marijuana.  Patient is awake, alert, oriented x 4.  He states that he is able to care for his own colostomy, and is hopeful that it can 1 day be  reversed. Based on my interview the patient does have capacity to make important life affecting decisions for his care.  Patient specifically denies any active or passive suicidal or homicidal ideation, plan, or intent.  He denies auditory or visual hallucinations.  He has been calm and cooperative upon assessment, and does not appear to be responding to internal stimuli. Please note that he can fluctuate and the need to be reassessed should patient's condition worsen. If there is a concern for patient having competency, this would need to be presented in court of law.  Full capacity   Past Psychiatric History: Schizophrenia versus schizoaffective disorder, bipolar type versus substance-induced psychosis  Risk to Self:  Denies Risk to Others:  Denies Prior Inpatient Therapy:  Yes Prior Outpatient Therapy:  Yes, current.  Patient would benefit from outpatient treatment and consideration of ACT team.  Past Medical History:  Past Medical History:  Diagnosis Date   Depression     Past Surgical History:  Procedure Laterality Date   COLOSTOMY N/A 11/05/2021   Procedure: COLOSTOMY;  Surgeon: Jesusita Oka, MD;  Location: [facility];  Service: General;  Laterality: N/A;   IR RADIOLOGIST EVAL & MGMT  12/11/2021   LAPAROTOMY N/A 11/05/2021   Procedure: EXPLORATORY LAPAROTOMY;  Surgeon: Jesusita Oka, MD;  Location: [place];  Service: General;  Laterality: N/A;   PARTIAL COLECTOMY N/A 11/05/2021   Procedure: PARTIAL COLECTOMY WITH REMOVAL SPLENIC FLEXURE;  Surgeon: Jesusita Oka, MD;  Location: [place];  Service: General;  Laterality: N/A;   SPLENECTOMY, 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 No pertinent family history. Family Psychiatric  History: unknown  Social History:  Social History   Substance and Sexual Activity  Alcohol Use Yes     Social History    Substance and Sexual Activity  Drug Use Yes   Types: Cocaine, Benzodiazepines, Marijuana    Social History   Socioeconomic History   Marital status: Single    Spouse name: Not on file   Number of children: Not on file   Years of education: Not on file   Highest education level: Not on file  Occupational History   Occupation: Unemployed  Tobacco Use   Smoking status: Every Day    Types: Cigarettes, Cigars    Passive exposure: Current   Smokeless tobacco: Current  Vaping Use   Vaping Use: Every day   Substances: Nicotine, THC, Synthetic cannabinoids  Substance and Sexual Activity   Alcohol use: Yes   Drug use: Yes    Types: Cocaine, Benzodiazepines, Marijuana   Sexual activity: Not Currently  Other Topics Concern   Not on file  Social History Narrative   ** Merged History Encounter **       Social Determinants of Health   Financial Resource Strain: Not on file  Food Insecurity: Not on file  Transportation Needs: Not on file  Physical Activity: Not on file  Stress: Not on file  Social Connections: Not on file   Additional Social History: essentially homeless, but states he stays with his grandmother and his uncle can pick him up.    Allergies:  No Known Allergies  Labs:  Results for orders placed or performed during the hospital encounter of 03/30/22 (from the past 48 hour(s))  SARS Coronavirus 2 by RT PCR (hospital order, performed in [facility] hospital lab) *cepheid single result test* Anterior Nasal Swab     Status: Abnormal   Collection Time: 04/09/22  4:45 PM   Specimen: Anterior Nasal Swab  Result Value Ref Range   SARS Coronavirus 2 by RT PCR POSITIVE (A) NEGATIVE    Comment: (NOTE) SARS-CoV-2 target nucleic acids are DETECTED  SARS-CoV-2 RNA is generally detectable in upper respiratory specimens  during the acute phase of infection.  Positive results are indicative  of the presence of the identified virus, but do not rule out bacterial infection  or co-infection with other pathogens not detected by the test.  Clinical correlation with patient history and  other diagnostic information is necessary to determine patient infection status.  The expected result is negative.  Fact Sheet for Patients:   RoadLapTop.co.za   Fact Sheet for Healthcare Providers:   http://kim-miller.com/    This test is not yet approved or cleared by the [place] FDA and  has been authorized for detection and/or diagnosis of SARS-CoV-2 by FDA under an Emergency Use Authorization (EUA).  This EUA will remain in effect (meaning this test can be used) for the duration of  the COVID-19 declaration under Section 564(b)(1)  of the Act, 21 U.S.C. section 360-bbb-3(b)(1), unless the authorization is terminated or revoked sooner.   Performed at [facility], [address], [place], [place] [address]     Medications:  Current Facility-Administered Medications  Medication Dose Route Frequency Provider Last Rate Last Admin   acetaminophen (TYLENOL) tablet 650 mg  650 mg Oral Q4H PRN Arby Barrette, MD   650 mg at 04/06/22 1813   haloperidol  lactate (HALDOL) injection 5 mg  5 mg Intramuscular Q6H PRN Kommor, Madison, MD   5 mg at 04/07/22 0255   LORazepam (ATIVAN) injection 2 mg  2 mg Intramuscular Q6H PRN Kommor, Madison, MD   2 mg at 04/07/22 2054   OLANZapine zydis (ZYPREXA) disintegrating tablet 10 mg  10 mg Oral Q8H PRN Leevy-Johnson, Brooke A, NP   10 mg at 04/01/22 2058   OLANZapine zydis (ZYPREXA) disintegrating tablet 10 mg  10 mg Oral BID Charmaine Downs C, NP   10 mg at 04/11/22 1051   Current Outpatient Medications  Medication Sig Dispense Refill   OLANZapine (ZYPREXA) 10 MG tablet Take 1 tablet (10 mg total) by mouth 2 (two) times daily. 60 tablet 1   buPROPion (WELLBUTRIN XL) 150 MG 24 hr tablet Take 1 tablet (150 mg total) by mouth daily. (Patient not taking: Reported on  11/20/2021) 30 tablet 0   sodium chloride flush (NS) 0.9 % SOLN 5 mLs by Intracatheter route daily. (Patient not taking: Reported on 03/12/2022) 15 Syringe 1    Musculoskeletal: Strength & Muscle Tone: within normal limits Gait & Station:  Not assessed, patient remained in bed Patient leans: N/A          Psychiatric Specialty Exam:  Presentation  General Appearance: Appropriate for Environment  Eye Contact:Good  Speech:Clear and Coherent; Normal Rate  Speech Volume:Normal  Handedness:Right   Mood and Affect  Mood:Euthymic  Affect:Congruent   Thought Process  Thought Processes:Coherent; Goal Directed  Descriptions of Associations:Intact  Orientation:Full (Time, Place and Person)  Thought Content:Logical  History of Schizophrenia/Schizoaffective disorder:Yes  Duration of Psychotic Symptoms:Greater than six months  Hallucinations:Hallucinations: None Description of Auditory Hallucinations: denies  Ideas of Reference:None  Suicidal Thoughts:Suicidal Thoughts: No  Homicidal Thoughts:Homicidal Thoughts: No   Sensorium  Memory:Immediate Good; Recent Fair; Remote Fair  Judgment:Fair  Insight:Fair   Executive Functions  Concentration:Fair  Attention Span:Good  [place]  Language:Good   Psychomotor Activity  Psychomotor Activity:Psychomotor Activity: Normal   Assets  Assets:Communication Skills; Desire for Improvement; Housing   Sleep  Sleep:Sleep: Good    Physical Exam: Physical Exam Vitals and nursing note reviewed.  Constitutional:      Appearance: Normal appearance. He is normal weight.  HENT:     Head: Normocephalic and atraumatic.  Eyes:     Extraocular Movements: Extraocular movements intact.  Cardiovascular:     Rate and Rhythm: Normal rate.  Pulmonary:     Effort: Pulmonary effort is normal. No respiratory distress.  Abdominal:     Comments: Colostomy clean and intact  Musculoskeletal:         General: Normal range of motion.  Neurological:     General: No focal deficit present.     Mental Status: He is alert and oriented to person, place, and time.    Review of Systems  Constitutional: Negative.   Respiratory: Negative.    Cardiovascular: Negative.   Gastrointestinal:        Colostomy  Musculoskeletal: Negative.   Neurological: Negative.   Psychiatric/Behavioral:  Positive for substance abuse. Negative for depression, hallucinations and suicidal ideas. The patient has insomnia.    Blood pressure 99/80, pulse (!) 56, temperature (!) 97.4 F (36.3 C), temperature source Oral, resp. rate 16, height 6\' 1"  (1.854 m), weight 72.6 kg, SpO2 100 %. Body mass index is 21.11 kg/m.  Treatment Plan Summary: At this time, patient does not meet criteria for involuntary commitment.  Disposition:   This service was  provided via telemedicine using a 2-way, interactive audio and video technology.  Names of all persons participating in this telemedicine service and their role in this encounter. Name: Samer Dutton is a 26 y.o. male  Role: patient  Name: Mariel Craft, MD  Role: Psychiatrist    Mariel Craft, MD 04/11/2022 2:37 PM

## 2022-04-11 NOTE — ED Notes (Signed)
Pt calm and has been sleeping. Pt woke up and was very cooperative.
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
## 2022-04-11 NOTE — ED Provider Notes (Signed)
Emergency Medicine Observation Re-evaluation Note  Dalton Moore is a 26 y.o. male, seen on rounds today.  Pt initially presented to the ED for complaints of Psychiatric Evaluation Currently, the patient is resting quietly.  Physical Exam  BP 99/80 (BP Location: Left Arm)   Pulse (!) 56   Temp (!) 97.4 F (36.3 C) (Oral)   Resp 16   Ht 6\' 1"  (1.854 m)   Wt 72.6 kg   SpO2 100%   BMI 21.11 kg/m  Physical Exam General: No acute distress Cardiac: Well-perfused Lungs: Nonlabored Psych: Cooperative  ED Course / MDM  EKG:EKG Interpretation  Date/Time:  Sunday March 30 2022 11:27:25 EDT Ventricular Rate:  76 PR Interval:  142 QRS Duration: 103 QT Interval:  414 QTC Calculation: 466 R Axis:   39 Text Interpretation: Sinus rhythm Probable left atrial enlargement RSR' in V1 or V2, probably normal variant ST elev, probable normal early repol pattern agree, no old comparison Confirmed by 06-04-1990 [phone]) on 03/30/2022 3:45:00 PM  I have reviewed the labs performed to date as well as medications administered while in observation.  Recent changes in the last 24 hours include patient COVID-positive.  Plan  Current plan is for inpatient psychiatric care once clears quarantine.  1:45 PM.  I was informed by psychiatry that the patient has been psychiatrically cleared.  They are rescinding his IVC and are checking with his mother regarding his discharge.  They will keep me informed if the plan is finalized.    05/30/2022, MD 04/11/22 [phone]

## 2022-06-26 ENCOUNTER — Emergency Department (HOSPITAL_COMMUNITY)
Admission: EM | Admit: 2022-06-26 | Discharge: 2022-07-03 | Disposition: A | Discharge status: Other Acute Inpatient Hospital | Payer: Medicaid Other | Attending: Emergency Medicine | Admitting: Emergency Medicine

## 2022-06-26 ENCOUNTER — Encounter (HOSPITAL_COMMUNITY): Payer: Self-pay

## 2022-06-26 ENCOUNTER — Other Ambulatory Visit: Payer: Self-pay

## 2022-06-26 DIAGNOSIS — F4310 Post-traumatic stress disorder, unspecified: Secondary | ICD-10-CM | POA: Insufficient documentation

## 2022-06-26 DIAGNOSIS — Z5900 Homelessness unspecified: Secondary | ICD-10-CM | POA: Diagnosis not present

## 2022-06-26 DIAGNOSIS — Z046 Encounter for general psychiatric examination, requested by authority: Secondary | ICD-10-CM | POA: Diagnosis present

## 2022-06-26 DIAGNOSIS — F17210 Nicotine dependence, cigarettes, uncomplicated: Secondary | ICD-10-CM | POA: Insufficient documentation

## 2022-06-26 DIAGNOSIS — Z20822 Contact with and (suspected) exposure to covid-19: Secondary | ICD-10-CM | POA: Diagnosis not present

## 2022-06-26 DIAGNOSIS — F209 Schizophrenia, unspecified: Secondary | ICD-10-CM | POA: Diagnosis not present

## 2022-06-26 DIAGNOSIS — F29 Unspecified psychosis not due to a substance or known physiological condition: Secondary | ICD-10-CM | POA: Diagnosis not present

## 2022-06-26 DIAGNOSIS — F22 Delusional disorders: Secondary | ICD-10-CM | POA: Insufficient documentation

## 2022-06-26 DIAGNOSIS — F2089 Other schizophrenia: Secondary | ICD-10-CM | POA: Diagnosis not present

## 2022-06-26 HISTORY — DX: Post-traumatic stress disorder, unspecified: F43.10

## 2022-06-26 HISTORY — DX: Schizophrenia, unspecified: F20.9

## 2022-06-26 LAB — COMPREHENSIVE METABOLIC PANEL
ALT: 30 U/L (ref 0–44)
AST: 25 U/L (ref 15–41)
Albumin: 4.4 g/dL (ref 3.5–5.0)
Alkaline Phosphatase: 125 U/L (ref 38–126)
Anion gap: 5 (ref 5–15)
BUN: 15 mg/dL (ref 6–20)
CO2: 28 mmol/L (ref 22–32)
Calcium: 9.4 mg/dL (ref 8.9–10.3)
Chloride: 108 mmol/L (ref 98–111)
Creatinine, Ser: 0.82 mg/dL (ref 0.61–1.24)
GFR, Estimated: >60 mL/min (ref >60)
Glucose, Bld: 95 mg/dL (ref 70–99)
Potassium: 3.8 mmol/L (ref 3.5–5.1)
Sodium: 141 mmol/L (ref 135–145)
Total Bilirubin: 0.2 mg/dL — ABNORMAL LOW (ref 0.3–1.2)
Total Protein: 7.2 g/dL (ref 6.5–8.1)

## 2022-06-26 LAB — CBC WITH DIFFERENTIAL/PLATELET
Abs Immature Granulocytes: 0.02 10*3/uL (ref 0.00–0.07)
Basophils Absolute: 0.1 10*3/uL (ref 0.0–0.1)
Basophils Relative: 1 %
Eosinophils Absolute: 0.1 10*3/uL (ref 0.0–0.5)
Eosinophils Relative: 1 %
HCT: 42.9 % (ref 39.0–52.0)
Hemoglobin: 14.3 g/dL (ref 13.0–17.0)
Immature Granulocytes: 0 %
Lymphocytes Relative: 13 %
Lymphs Abs: 1.5 10*3/uL (ref 0.7–4.0)
MCH: 29.4 pg (ref 26.0–34.0)
MCHC: 33.3 g/dL (ref 30.0–36.0)
MCV: 88.3 fL (ref 80.0–100.0)
Monocytes Absolute: 1 10*3/uL (ref 0.1–1.0)
Monocytes Relative: 9 %
Neutro Abs: 8.7 10*3/uL — ABNORMAL HIGH (ref 1.7–7.7)
Neutrophils Relative %: 76 %
Platelets: 355 10*3/uL (ref 150–400)
RBC: 4.86 MIL/uL (ref 4.22–5.81)
RDW: 15 % (ref 11.5–15.5)
WBC: 11.4 10*3/uL — ABNORMAL HIGH (ref 4.0–10.5)
nRBC: 0 % (ref 0.0–0.2)

## 2022-06-26 LAB — ACETAMINOPHEN LEVEL: Acetaminophen (Tylenol), Serum: <10 ug/mL — ABNORMAL LOW (ref 10–30)

## 2022-06-26 LAB — RESP PANEL BY RT-PCR (FLU A&B, COVID) ARPGX2
Influenza A by PCR: NEGATIVE
Influenza B by PCR: NEGATIVE
SARS Coronavirus 2 by RT PCR: NEGATIVE

## 2022-06-26 LAB — ETHANOL: Alcohol, Ethyl (B): <10 mg/dL (ref <10)

## 2022-06-26 LAB — SALICYLATE LEVEL: Salicylate Lvl: <7 mg/dL — ABNORMAL LOW (ref 7.0–30.0)

## 2022-06-26 MED ORDER — LORAZEPAM 1 MG PO TABS
1.0000 mg | ORAL_TABLET | ORAL | Status: AC | PRN
  Administered 2022-06-26: 1 mg via ORAL
  Filled 2022-06-26: qty 1

## 2022-06-26 MED ORDER — IBUPROFEN 200 MG PO TABS: 600.0000 mg | ORAL_TABLET | Freq: Three times a day (TID) | ORAL | Status: DC | PRN

## 2022-06-26 MED ORDER — ALUM & MAG HYDROXIDE-SIMETH 200-200-20 MG/5ML PO SUSP: 30.0000 mL | Freq: Four times a day (QID) | ORAL | Status: DC | PRN

## 2022-06-26 MED ORDER — RISPERIDONE 1 MG PO TBDP
2.0000 mg | ORAL_TABLET | Freq: Three times a day (TID) | ORAL | Status: DC | PRN
  Administered 2022-06-26 – 2022-07-02 (×10): 2 mg via ORAL
  Filled 2022-06-26 (×2): qty 4
  Filled 2022-06-26: qty 2
  Filled 2022-06-26 (×4): qty 4
  Filled 2022-06-26 (×3): qty 2
  Filled 2022-06-26 (×2): qty 4

## 2022-06-26 MED ORDER — ZIPRASIDONE MESYLATE 20 MG IM SOLR
20.0000 mg | INTRAMUSCULAR | Status: DC | PRN
  Filled 2022-06-26: qty 20

## 2022-06-26 MED ORDER — ONDANSETRON HCL 4 MG PO TABS: 4.0000 mg | ORAL_TABLET | Freq: Three times a day (TID) | ORAL | Status: DC | PRN

## 2022-06-26 MED ORDER — STERILE WATER FOR INJECTION IJ SOLN
INTRAMUSCULAR | Status: AC
  Filled 2022-06-26: qty 10
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
## 2022-06-26 NOTE — ED Notes (Addendum)
Patient becoming increasingly agitated at this time even after administration of Risperdal. PA Vernona Rieger made aware. Patient walked out of EMS bay. Per Vernona Rieger, [place] IVC papers in process.

## 2022-06-26 NOTE — ED Notes (Addendum)
New colostomy bag placed after patient self removed previous bag.

## 2022-06-26 NOTE — ED Notes (Signed)
Patient sleeping.  Breathes regular and unlabored.Marland Kitchen

## 2022-06-26 NOTE — Progress Notes (Addendum)
ADDENDUM  Patient denied by Dalton Moore due to need for colostomy bag.   Signed:  Corky Crafts, MSW, LCSWA, LCAS 06/26/2022 4:14 PM  ADDENDUM  Patient currently under review at [facility], admissions coordinator provided with RN contact information for further questions. Situation ongoing, CSW will continue to monitor and update note as more information becomes available.   Signed:  Corky Crafts, MSW, [place], LCAS 06/26/2022 3:54 PM   [facility] LCSW Progress Note   06/26/2022    1:26 PM  Bing Quarry   [phone]   Type of Contact and Topic: Psychiatric Bed Placement    Patient meets inpatient criteria per Nira Conn, NP. Patient referred to the following facilities:  Service Provider Request Status Selected Services Address Phone Fax Patient Preferred  [facility]  Pending - Request Sent N/A [address], [place] [address] [phone] --  [facility]  Pending - Request Sent N/A [address], [place] [address] [phone] --  [facility]  Pending - Request Sent N/A [address], [place] [address] [phone] --  [facility]  Pending - Request Sent N/A [address], [place] [address] [phone] --  [facility]  Pending - Request Sent N/A [address] [place] [address] [phone] --  [facility]  Pending - Request Sent N/A [address], [place] [address] [phone] --  [facility]  Pending - Request Sent N/A [address] [place] [address] [phone] --  [facility]  Pending - Request Sent N/A [address] [place] [address] [phone] --  [facility]  Pending - Request Sent N/A [address] [place] [address] [phone] --    CSW will continue to  monitor disposition.   Signed:  Corky Crafts, MSW, [place], LCASA 06/26/2022 1:26 PM

## 2022-06-26 NOTE — ED Notes (Signed)
Patient visualized arguing with hallucinations in his room.
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
## 2022-06-26 NOTE — ED Notes (Signed)
On patient's way to room patient was stating that he left because he was going to kill himself. Patient currently sitting in bed at this time. Currently calm and cooperative.

## 2022-06-26 NOTE — ED Notes (Signed)
Shan from [facility] called asking questions about resident nurse able to assist, patient awaiting placement.

## 2022-06-26 NOTE — ED Notes (Signed)
Patient escorted back to ED by [employer].

## 2022-06-26 NOTE — Progress Notes (Signed)
When we tried to get pt VS. Pt started to scream and jumping in and out in the bed. Pt remain agitated and unable to direct. Ativan P.O. Given. Will continue to monitor.
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
## 2022-06-26 NOTE — ED Notes (Signed)
Patient to room 29.  Patient oriented to unit and room.   Patient sleepy.  Patient resting comfortably in bed, breaths regular and unlabored.

## 2022-06-27 ENCOUNTER — Ambulatory Visit (HOSPITAL_COMMUNITY): Payer: Medicaid Other | Admitting: Nurse Practitioner

## 2022-06-27 DIAGNOSIS — F2089 Other schizophrenia: Secondary | ICD-10-CM

## 2022-06-27 MED ORDER — ZIPRASIDONE MESYLATE 20 MG IM SOLR
20.0000 mg | Freq: Once | INTRAMUSCULAR | Status: AC
  Administered 2022-06-27: 20 mg via INTRAMUSCULAR
  Filled 2022-06-27: qty 20

## 2022-06-27 MED ORDER — STERILE WATER FOR INJECTION IJ SOLN
INTRAMUSCULAR | Status: AC
  Administered 2022-06-27: 10 mL
  Filled 2022-06-27: qty 10

## 2022-06-27 MED ORDER — STERILE WATER FOR INJECTION IJ SOLN
INTRAMUSCULAR | Status: AC
  Filled 2022-06-27: qty 10

## 2022-06-27 MED ORDER — ZIPRASIDONE MESYLATE 20 MG IM SOLR
20.0000 mg | Freq: Two times a day (BID) | INTRAMUSCULAR | Status: DC | PRN
  Administered 2022-06-30 – 2022-07-01 (×2): 20 mg via INTRAMUSCULAR
  Filled 2022-06-27 (×4): qty 20

## 2022-06-27 MED ORDER — OLANZAPINE 10 MG PO TABS
10.0000 mg | ORAL_TABLET | Freq: Two times a day (BID) | ORAL | Status: DC
  Administered 2022-06-27 – 2022-07-03 (×13): 10 mg via ORAL
  Filled 2022-06-27 (×13): qty 1

## 2022-06-27 NOTE — Progress Notes (Signed)
[facility] LCSW Progress Note   06/27/2022    4:03 PM  Dalton Moore   [id number]   Type of Contact and Topic:  Bed Placement   Patient has been denied by the following facilities due to need for colostomy bag:  [facility]  [facility]  CSW to continue pursuing placement.   Situation ongoing, CSW will continue to monitor and update note as more information becomes available.    Signed:  Corky Crafts, MSW, LCSWA, LCAS 06/27/2022 4:03 PM

## 2022-06-27 NOTE — ED Notes (Signed)
Pt standing in the doorway of his room speaking with flight of ideas and rambling. Pt is responding to external stimuli.

## 2022-06-27 NOTE — Progress Notes (Signed)
Patient has been denied by [facility] due to no appropriate beds available. Patient meets BH inpatient criteria per Nira Conn, NP. Patient has been faxed out to the following facilities:    [facility]  [address] [place] [address] [phone]  [facility]  [address] [place] [address] [phone]  [facility] [address]  [address] [place] [address] [phone]  [facility]  [address], [place] [address] [phone]  [facility]  [address] [place] [address] [phone]  [facility]  [address], [place] [address] [phone]    Damita Dunnings, MSW, LCSW-A  9:42 PM 06/27/2022

## 2022-06-27 NOTE — ED Notes (Signed)
Pt throwing tray in room cursing at staff at this time. Security at bedside at this time.

## 2022-06-27 NOTE — ED Provider Notes (Signed)
Emergency Medicine Observation Re-evaluation Note  Dalton Moore is a 26 y.o. male, seen on rounds today.  Pt initially presented to the ED for complaints of Psychiatric Evaluation Currently, the patient is asleep.  Pt presented yesterday morning with bizarre behavior and SI.  Pt was assessed by TTS who recommended inpatient admission.  Physical Exam  BP 124/60 (BP Location: Right Arm)   Pulse 79   Temp 98.5 F (36.9 C) (Oral)   Resp 18   SpO2 100%  Physical Exam General: asleep Cardiac: rr Lungs: clear Psych: asleep  ED Course / MDM  EKG:EKG Interpretation  Date/Time:  Thursday June 26 2022 10:44:50 EST Ventricular Rate:  64 PR Interval:  142 QRS Duration: 105 QT Interval:  463 QTC Calculation: 478 R Axis:   58 Text Interpretation: Sinus arrhythmia RSR' in V1 or V2, probably normal variant Probable left ventricular hypertrophy ST elev, probable normal early repol pattern Borderline prolonged QT interval Confirmed by Glyn Ade [phone]) on 06/26/2022 1:23:32 PM  I have reviewed the labs performed to date as well as medications administered while in observation.  Recent changes in the last 24 hours include awaiting placement.  Plan  Current plan is for awaiting placement.    Jacalyn Lefevre, MD 06/27/22 [phone]
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
## 2022-06-27 NOTE — ED Notes (Signed)
Pts colostomy bag changed at this time.

## 2022-06-27 NOTE — ED Notes (Signed)
Pt slept most of shifty to this point no interaction with staff. No distress or disturbed sleep patterns noted.

## 2022-06-27 NOTE — ED Notes (Signed)
Pt resting comfortably at this time.

## 2022-06-27 NOTE — ED Notes (Signed)
Patient got more agitated, yelling and verbally abusive, Took of his Colostomy bag and throw it in the trash.  EDP informed. Geodon ordered. Patient apply his own colostomy bag. Patient went back to sleep. Will continue to monitor.

## 2022-06-27 NOTE — ED Notes (Signed)
Colostomy bag changed   pt OOB requesting food th HS medication given then pt return to bed and sleep.

## 2022-06-27 NOTE — BH Assessment (Signed)
Patient denied at [facility] by staff [facility]) due to colostomy bag @0111 .

## 2022-06-28 NOTE — ED Notes (Signed)
Second time this shift pt has removed his colostomy bag and required new replacement bag. So far Dalton Moore has been cooperative with staff request although he does appear to be responding to internal stimuli.

## 2022-06-28 NOTE — ED Notes (Signed)
New colostomy bag given to pt.

## 2022-06-28 NOTE — ED Notes (Signed)
Pt having rambling conversation about marital arts accompanied by posturing and karate stance will continue to assess behavior.

## 2022-06-28 NOTE — ED Notes (Signed)
Pt awake in room, talking to person not present.

## 2022-06-28 NOTE — ED Provider Notes (Signed)
Emergency Medicine Observation Re-evaluation Note  Dalton Moore is a 26 y.o. male, seen on rounds today.  Pt initially presented to the ED for complaints of Psychiatric Evaluation Currently, the patient is resting comfortably.  Pt presented yesterday morning with bizarre behavior and SI.  Pt was assessed by TTS who recommended inpatient admission.  Physical Exam  BP 121/84 (BP Location: Right Arm)   Pulse 81   Temp 97.9 F (36.6 C) (Oral)   Resp 20   SpO2 100%  Physical Exam General: resting comfortably Cardiac: well-perfused Lungs: no increased WOB Psych: resting comfortably  ED Course / MDM  EKG:EKG Interpretation  Date/Time:  Friday June 27 2022 07:12:09 EST Ventricular Rate:  52 PR Interval:  134 QRS Duration: 98 QT Interval:  458 QTC Calculation: 425 R Axis:   110 Text Interpretation: ** Suspect arm lead reversal, interpretation assumes no reversal Sinus bradycardia Right axis deviation ST elevation, consider early repolarization, pericarditis, or injury Abnormal ECG No previous ECGs available Confirmed by Glyn Ade [phone]) on 06/28/2022 2:07:14 PM  I have reviewed the labs performed to date as well as medications administered while in observation.  Recent changes in the last 24 hours include talking to people not present, had become agitated briefly this AM.  Plan  Current plan is for awaiting placement.    Loetta Rough, MD 06/28/22 [phone]

## 2022-06-28 NOTE — ED Notes (Signed)
New colostomy bag placed on pt.

## 2022-06-28 NOTE — Progress Notes (Signed)
Per Nira Conn, NP, patient meets criteria for inpatient treatment. There are no available  beds at [facility] today. CSW faxed referrals to the following facilities for review:  [facility]  Pending - Request Sent N/A [address], [place] [address] [phone] --  [facility]  Pending - Request Sent N/A [address] [place] [address] [phone] --  [facility]  Pending - Request Sent N/A [address], [place] [address] [phone] --  [facility]  Pending - Request Sent N/A [address], [place] [address] [phone] --  [facility]  Pending - Request Sent N/A [address] [place] [address] [phone] --  [facility]  Pending - Request Sent N/A [address] [place] [address] [phone] --  [facility]  Pending - Request Sent N/A [address], [place] [address] [phone] --  [facility]  Pending - Request Sent N/A [address] [place] [address] [phone] --  [facility]  Pending - Request Sent N/A [address] [place], [place] [address] [phone] --  [facility]  Pending - Request Sent N/A [address] [place] [address] [phone] --  [facility]  Pending - Request Sent N/A [address], [place] [address] [phone] --  [facility]  Pending - Request Sent N/A [address] [place] [address] [phone] --  [facility]  Pending - Request Sent N/A [address] [place] [address] [phone] --  [facility]  Pending - Request Sent N/A [address] [place] [address]  [phone] --  [facility]  Declined N/A [address], [place] [address] [phone] --  [facility] N/A [address] [place] [address] [phone] --  [facility]  Declined N/A [address] [place] [address] [phone] --   TTS will continue to seek bed placement.  Crissie Reese, MSW, Lenice Pressman Phone: [phone] Disposition/TOC\

## 2022-06-28 NOTE — ED Notes (Signed)
Pt becoming agitated, talking to voices, swearing at voices.
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
## 2022-06-28 NOTE — ED Notes (Signed)
Pt using phone to call mother

## 2022-06-29 NOTE — ED Notes (Signed)
Patient responding to internal stimuli.  Rambling. Talking to self.

## 2022-06-29 NOTE — ED Notes (Signed)
Pate remains asleep with no signs of distress noted.

## 2022-06-29 NOTE — ED Notes (Signed)
New colostomy bag provided to patient.  Cut to correct size.  Patient able to apply new bag himself.  Provided with new scrub top.
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
## 2022-06-29 NOTE — ED Notes (Signed)
Patient alert.  Patient ate breakfast. Patient compliant with medication.   Patient hyper verbal.  Rambling.

## 2022-06-30 MED ORDER — BUPROPION HCL ER (XL) 150 MG PO TB24
150.0000 mg | ORAL_TABLET | Freq: Every day | ORAL | Status: DC
  Administered 2022-06-30 – 2022-07-03 (×4): 150 mg via ORAL
  Filled 2022-06-30 (×4): qty 1

## 2022-06-30 MED ORDER — LIP MEDEX EX OINT
TOPICAL_OINTMENT | Freq: Once | CUTANEOUS | Status: AC
  Administered 2022-06-30: 75 via TOPICAL
  Filled 2022-06-30: qty 7

## 2022-06-30 MED ORDER — STERILE WATER FOR INJECTION IJ SOLN
INTRAMUSCULAR | Status: AC
  Administered 2022-06-30: 1.3 mL
  Filled 2022-06-30: qty 10

## 2022-06-30 NOTE — ED Provider Notes (Signed)
  Physical Exam  BP (!) 150/85 (BP Location: Left Arm)   Pulse 89   Temp 99.1 F (37.3 C) (Oral)   Resp 18   SpO2 100%   Physical Exam  Procedures  Procedures  ED Course / MDM   Clinical Course as of 06/30/22 0856  Thu Jun 26, 2022  1108 Medically cleared for behavioral health evaluation and disposition. [LM]    Clinical Course User Index [LM] Jeannie Fend, PA-C   Medical Decision Making Amount and/or Complexity of Data Reviewed Labs: ordered.  Risk OTC drugs. Prescription drug management.   Patient pending inpatient psychiatric treatment.  Rather uneventful overnight.  Patient has been in the ER for 98 hours.       Benjiman Core, MD 06/30/22 [id number]

## 2022-06-30 NOTE — ED Notes (Signed)
Patient resting with eyes closed at this time.  Vital signs deferred to allow patient to rest.  Will obtain once patient is awake due to patient complaining of intermittent waking during night.

## 2022-06-30 NOTE — Progress Notes (Signed)
Per [facility] Rona Ravens, RN, there is not a bed available at [facility].CSW will assist and follow with placement.  Cathie Beams, [place]  06/30/2022 4:13 PM

## 2022-06-30 NOTE — BH Assessment (Addendum)
Per Dahlia Byes, NP, patient continues to meet criteria for inpatient psychiatric treatment.   Patient referred to [facility] for consideration of bed placement. Per [facility] (Danika, RN) no appropriate beds are available. Patient referred to the following facilities for consideration of placement.   [facility]  Pending - Request Sent N/A [address], [place] [address] [phone] --  [facility]  Pending - Request Sent N/A [address], [place] [address] [phone] --  [facility]  Pending - Request Sent N/A [address] [place] [address] [phone] --  [facility]  Pending - Request Sent N/A [address] [place] [address] [phone] --  [facility]  Pending - Request Sent N/A [address] [place] [address] [phone] --  [facility]  Pending - Request Sent N/A [address], [place] [address] [phone] --  [facility]  Pending - Request Sent N/A [address] [place] [address] [phone] --  [facility]  Pending - Request Sent N/A [address] [place] [address] [phone] --  [facility]  Pending - Request Sent N/A [address] [place], [place] [address] [phone] --  [facility]  Pending - Request Sent N/A [address] [place] [address] [phone] --  [facility]  Pending - Request Sent N/A [address], [place] [address] [phone] --  [facility]  Pending - Request Sent N/A [address], [place] [address] [phone] --  [facility]  Pending - Request Sent N/A [address], [place] [address]  [phone] --  [facility]  Pending - Request Sent N/A [address] [place] [address] [phone]

## 2022-06-30 NOTE — BH Assessment (Signed)
@  2215, Patient declined at [facility] due to Dalton Moore stomy bag
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
## 2022-06-30 NOTE — ED Notes (Signed)
Patient currently awake and requesting a snack.  States he always wakes up at this time in the morning and a snack helps him go back to sleep.  Small snack provided

## 2022-06-30 NOTE — ED Notes (Signed)
Pts dinner tray just arrived.

## 2022-06-30 NOTE — ED Notes (Signed)
Continues to have a rambling conversation talking about demons death and suicide. Pt asked if he was having thoughts of hurting self he replies yes. PRN risperdal  given at 2019 so far ineffective will give HS zyprexia per order and will reassess in 1 hour.

## 2022-06-30 NOTE — ED Notes (Signed)
Dalton Moore remains agitated pulled off colostomy bag sliping feces all over himself and bed . Hyper verbal rambling conversation remains very animated and focused on death and dying stating he has attempted suicide over 30 times in the past and still feels suicidal. Will medicate him with geodon 20 mg IM. [name] agreed he needs medications to help him calm down and willingly allowing this Clinical research associate to medicate him without assistance or security presences.

## 2022-06-30 NOTE — Progress Notes (Signed)
CSW requested [facility] AC Rona Ravens to review pt for [employer] CSW will assist and follow with placement.  Cathie Beams, [place]  06/30/2022 4:00 PM

## 2022-07-01 ENCOUNTER — Encounter (HOSPITAL_COMMUNITY): Payer: Self-pay

## 2022-07-01 DIAGNOSIS — F209 Schizophrenia, unspecified: Secondary | ICD-10-CM

## 2022-07-01 DIAGNOSIS — F29 Unspecified psychosis not due to a substance or known physiological condition: Secondary | ICD-10-CM

## 2022-07-01 MED ORDER — DIVALPROEX SODIUM ER 500 MG PO TB24
500.0000 mg | ORAL_TABLET | Freq: Every day | ORAL | Status: DC
  Administered 2022-07-01 – 2022-07-03 (×3): 500 mg via ORAL
  Filled 2022-07-01 (×3): qty 1

## 2022-07-01 MED ORDER — STERILE WATER FOR INJECTION IJ SOLN
INTRAMUSCULAR | Status: AC
  Filled 2022-07-01: qty 10

## 2022-07-01 MED ORDER — LORAZEPAM 2 MG/ML IJ SOLN
2.0000 mg | Freq: Once | INTRAMUSCULAR | Status: AC
  Administered 2022-07-01: 2 mg via INTRAMUSCULAR
  Filled 2022-07-01: qty 1
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
## 2022-07-01 NOTE — ED Provider Notes (Signed)
Emergency Medicine Observation Re-evaluation Note  Dalton Moore is a 26 y.o. male, seen on rounds today.  Pt initially presented to the ED for complaints of Psychiatric Evaluation Currently, the patient is sleeping.  Physical Exam  BP 117/68 (BP Location: Right Arm)   Pulse 78   Temp 97.9 F (36.6 C) (Oral)   Resp 16   SpO2 97%  Physical Exam General: No acute distress Cardiac: Regular rate Lungs: Normal effort   ED Course / MDM  EKG:EKG Interpretation  Date/Time:  Friday June 27 2022 07:12:09 EST Ventricular Rate:  52 PR Interval:  134 QRS Duration: 98 QT Interval:  458 QTC Calculation: 425 R Axis:   110 Text Interpretation: ** Suspect arm lead reversal, interpretation assumes no reversal Sinus bradycardia Right axis deviation ST elevation, consider early repolarization, pericarditis, or injury Abnormal ECG No previous ECGs available Confirmed by Glyn Ade [phone]) on 06/28/2022 2:07:14 PM  I have reviewed the labs performed to date as well as medications administered while in observation.  Recent changes in the last 24 hours include episodes of agitation and persistent suicidal ideation requiring Geodon  Plan  Current plan is for inpatient treatment.    Linwood Dibbles, MD 07/01/22 [phone]

## 2022-07-01 NOTE — Progress Notes (Signed)
LCSW Progress Note  [id number]   Dalton Moore  07/01/2022  3:44 PM  Description:   Inpatient Psychiatric Referral  Patient was recommended inpatient per Dahlia Byes, NP. [facility] has no available beds. CSW is requesting [facility] to review. Patient was referred to the following facilities:   Destination Service Provider Address Phone Fax  [facility]  [address] [place] [address] [phone]  [facility]  [address] [place] [address] [phone]  [facility]  [address] [place] [address] [phone]  [facility]  [address], [place] [address] [phone]  [facility]  [address] [place] [address] [phone]  [facility]  [address], [place] [address] [phone]  [facility] [address] [place]  [address] [place] [address] [phone]  [facility]  [address] [place] [address] [phone]  [facility]  [address] [place], [place] [address] [phone]  [facility]  [address] [place] [address] [phone]  [facility]  [address] [place] [address] [phone]  [facility]  [address] [place] [address] [phone]  [facility]  [address], [place] [address] [phone]  [facility]  [address] [place] [address] [phone]  [facility]  [address] [place], [place] [address] [phone]  [facility] [address] [place] [address] [phone]   [facility]  [address], [place] [address] [phone]  [facility]  [address], [place] [address] [phone]  [facility]  [address] [place] [address] [phone]  [facility]  [address], [place] [address] [phone]  [facility]  [address] [place] [address] [phone]  [facility]  [address], [place] [address] [phone]      Situation ongoing, CSW to continue following and update chart as more information becomes available.      Asencion Islam  07/01/2022 3:44 PM

## 2022-07-01 NOTE — ED Notes (Signed)
Pt now lying in bed with eyes closed, rr even and unlabored. No acute distress noted

## 2022-07-01 NOTE — ED Notes (Addendum)
Pt took a shower, now is back in his room. New colostomy bag placed. Pt is conversing calmly with external stimuli and staff
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
## 2022-07-01 NOTE — ED Notes (Signed)
Pt changed out colostomy bag, changed in to a clean scrub top

## 2022-07-01 NOTE — ED Notes (Signed)
Pt calm, interacting appropriately with staff. Pt seems to be responding to external stimuli, having a conversation with someone sitting on the bed. Emptied own colostomy bag. This RN placed fabric tape on edges that were pulling off skin.

## 2022-07-01 NOTE — ED Notes (Signed)
Currently sleeping no further episodes of verbalized distress respirations are easy.

## 2022-07-01 NOTE — ED Notes (Signed)
Security still at bedside, pt in room still verbally threatening and screaming

## 2022-07-01 NOTE — ED Notes (Signed)
Labs faxed to [facility] assessment dept [phone]

## 2022-07-02 LAB — RESP PANEL BY RT-PCR (FLU A&B, COVID) ARPGX2
Influenza A by PCR: NEGATIVE
Influenza B by PCR: NEGATIVE
SARS Coronavirus 2 by RT PCR: NEGATIVE

## 2022-07-02 NOTE — Consult Note (Signed)
Patient is accepted at [facility] for tonight for inpatient Hospitalization.  Patient remains calm and cooperative and compliant with his medications.  He is seen responding to Internal stimuli and laughing and making hand gestures with unseen people.

## 2022-07-02 NOTE — Progress Notes (Signed)
LCSW Progress Note  [id number]   Byan Poplaski  07/02/2022  10:42 AM  Description:   Inpatient Psychiatric Referral  Patient was recommended inpatient per Dahlia Byes, NP. There are no available beds at [facility]. Patient was referred to the following facilities:    Destination Service Provider Address Phone Fax  [facility]  [address], [place] [address] [phone]  [facility]  [address] [place] [address] [phone]  [facility]  [address], [place] [address] [phone]  [facility]  [address], [place] [address] [phone]  [facility]  [address] [place] [address] [phone]  [facility]  [address] [place] [address] [phone]  [facility] [address] [place]  [address] [place] [address] [phone]  [facility]  [address] [place] [address] [phone]  [facility]  [address] [place], [place] [address] [phone]  [facility]  [address] [place] [address] [phone]  [facility]  [address] [place] [address] [phone]  [facility]  [address] [place] [address] [phone]  [facility]  [address] [place] [address] [phone]  [facility]  [address] [place] [address] [phone]  [facility]  [address] [place], [place] [address] [phone]  [facility] [address] [place] [address] [phone]  [facility]   [address], [place] [address] [phone]  [facility]  [address], [place] [address] [phone]  [facility]  [address] [place] [address] [phone]  [facility]  [address] [place], [place] [address] [phone]  [facility]  [address], [place] [address] [phone]  [facility]  [address], [place] [address] [phone]  [facility]  [address] [place] [address] [phone]      Situation ongoing, CSW to continue following and update chart as more information becomes available.      Cathie Beams, [place]  07/02/2022 10:42 AM

## 2022-07-02 NOTE — ED Provider Notes (Signed)
Emergency Medicine Observation Re-evaluation Note  Dalton Moore is a 26 y.o. male, seen on rounds today.  Pt initially presented to the ED for complaints of Psychiatric Evaluation Currently, the patient is asleep.  No problems overnight according to sitter.  Pt awaiting inpatient bed.  Physical Exam  BP 118/74 (BP Location: Right Arm)   Pulse 71   Temp 97.7 F (36.5 C) (Oral)   Resp 18   SpO2 97%  Physical Exam General: asleep Cardiac: rr Lungs: clear Psych: asleep  ED Course / MDM    I have reviewed the labs performed to date as well as medications administered while in observation.  Recent changes in the last 24 hours include none.  Plan  Current plan is for awaiting placement.    Jacalyn Lefevre, MD 07/02/22 [phone]

## 2022-07-02 NOTE — Progress Notes (Signed)
[facility] LCSW Progress Note   07/02/2022    8:56 PM  Bing Quarry   [id number]   Type of Contact and Topic:  Psychiatric Bed Placement   Pt accepted to [facility] Room 316     Patient meets inpatient criteria per Dahlia Byes, NP    The attending provider will be Dr. Toni Amend  Call report to [phone]  Alfonzo Feller, RN @ [facility] ED notified.     Pt scheduled  to arrive at [facility] TOMORROW AFTER 0800.    Damita Dunnings, MSW, LCSW-A  8:57 PM 07/02/2022

## 2022-07-02 NOTE — ED Notes (Addendum)
Call to [facility] adult psych unable to take pt d/t staffing issues but will accept pt in the AM.

## 2022-07-02 NOTE — ED Notes (Signed)
Patietn has been alert this shift. Medication compliant. Cooperative with staff at this time.  Patient has been sleeping.  No suicidal ideation noted. No homicidal ideation noted.

## 2022-07-02 NOTE — Progress Notes (Signed)
Inpatient Behavioral Health   Pt meets inpatient criteria per Earney Navy, NP. There are no available beds at [facility].  Referral was sent to the following facilities;    Destination  Service Provider Request Status Address Phone [facility]  Pending - Request Sent [address] [place] [address] [phone]  [facility]  Pending - Request Sent [address], [place] [address] [phone]  [facility]  Pending - Request Sent [address], [place] [address] [phone]  [facility]  Pending - Request Sent [address] [place] [address] [phone]  [facility]  Pending - Request Sent [address] [place] [address] [phone]  [facility]  Pending - Request Sent [address], [place] [address] [phone]  [facility]  Pending - Request Sent [address], [place] [address] [phone]  [facility]  Pending - Request Sent [address] [place] [address] [phone]  [facility]  Pending - Request Sent [address] [place], [place] [address] [phone]  [facility]  Pending - Request Sent [address] [place] [address] [phone]  [facility]  Pending - Request Sent [address], [place] [address] [phone]  [facility]  Pending - Request Sent [address], [place] [address] [phone]  [facility]  Pending - Request Sent [address], [place] [address] [phone]  [facility]  Pending - Request Sent [address], [place] [address] [phone]   [facility]  Pending - Request Sent [address] [place], [place] [address] [phone]  [facility]  Pending - Request [facility] [address] [place] [address] [phone]  [facility]  Pending - Request Sent [address], [place] [address] [phone]  [facility]  Pending - Request Sent [address] [place] [address] [phone]  [facility]  Pending - Request Sent [address] [place] [address] [phone]  [facility]  Pending - Request Sent [address], [place] [address] [phone]  [facility]  Declined [address], [place] [address] [phone]  [facility]  Declined [address], [place] [address] [phone]  [facility]  Declined [address] [place]., [place] [address] [phone]   Situation ongoing,  CSW will follow up.   Maryjean Ka, MSW, LCSWA 07/02/2022  @ 12:20 AM

## 2022-07-02 NOTE — Progress Notes (Signed)
CSW received phone call from [facility] intake specialist inquiring about placement. CSW referred intake specialist to speak to pt's current nurse, Lum Babe, RN to obtain information on pt's current symptoms.CSW will continue to assist and follow with placement.  Cathie Beams, [place]  07/02/2022 10:59 AM

## 2022-07-02 NOTE — Progress Notes (Signed)
Patient has been denied by [facility] due to no appropriate beds being available. Patient meets BH inpatient criteria per Dahlia Byes, NP. Patient has been faxed out to the following facilities:    [facility]  [address], [place] [address] [phone]  [facility]  [address] [place] [address] [phone]  [facility] [address]  [address] [place] [address] [phone]  [facility]  [address], [place] [address] [phone]  [facility]  [address] [place] [address] [phone]  [facility]  [address] [place] [address] [phone]  [facility] [address] [place]  [address] [place] [address] [phone]  [facility]  [address] [place] [address] [phone]  [facility]  [address] [place], [place] [address] [phone]  [facility]  [address] [place] [address] [phone]  [facility]  [address] [place] [address] [phone]  [facility]  [address] [place] [address] [phone]  [facility]  [address], [place] [address] [phone]  [facility]  [address], [place] [address] [phone]  [facility]  [address] [place], [place] [address] [phone]  [facility] [address] [place] [address] [phone]  [facility]  [address] [place] [address] [phone]  [facility]  [address], [place]  [place] [address] [phone]  [facility]  [address] [place] [address] [phone]  [facility]  [address] [place], [place] [address] [phone]  [facility]  [address], [place] [address] [phone]  [facility]  [address], [place] [address] [phone]  [facility]  [address], [place] [address] [phone]    Damita Dunnings, MSW, LCSW-A  8:33 PM 07/02/2022

## 2022-07-02 NOTE — Progress Notes (Signed)
Per morning meeting, pt was accepted to [facility] BMU by Dr. Toni Amend. However, after further review by [facility] nursing staff, pt has presented with agitation and shouting within the last 24 hours. Per Dr. Leary Roca, pt can be reviewed for acceptance for night shift (07/02/22), depending on pt's behaviors and decrease of agitation. CSW will continue to assist and follow with placement.  Cathie Beams, [place]  07/02/2022 10:38 AM

## 2022-07-02 NOTE — ED Notes (Signed)
Dalton Moore is very food focused has been given 2 sandwiches and 2 rounds of graham crackers with peanut butter and continues to request more food. He was asked if he had received brkfast lunch and dinner trays and he replied yes.

## 2022-07-03 ENCOUNTER — Other Ambulatory Visit: Payer: Self-pay

## 2022-07-03 ENCOUNTER — Encounter: Payer: Self-pay | Admitting: Psychiatry

## 2022-07-03 ENCOUNTER — Inpatient Hospital Stay
Admission: AD | Admit: 2022-07-03 | Discharge: 2022-07-04 | DRG: 885 | Disposition: A | Discharge status: Home / Self Care | Payer: Medicaid Other | Source: Intra-hospital | Attending: Psychiatry | Admitting: Psychiatry

## 2022-07-03 DIAGNOSIS — F209 Schizophrenia, unspecified: Secondary | ICD-10-CM | POA: Diagnosis not present

## 2022-07-03 DIAGNOSIS — F17210 Nicotine dependence, cigarettes, uncomplicated: Secondary | ICD-10-CM | POA: Diagnosis present

## 2022-07-03 DIAGNOSIS — Z433 Encounter for attention to colostomy: Secondary | ICD-10-CM

## 2022-07-03 DIAGNOSIS — F203 Undifferentiated schizophrenia: Principal | ICD-10-CM | POA: Diagnosis present

## 2022-07-03 DIAGNOSIS — F4310 Post-traumatic stress disorder, unspecified: Secondary | ICD-10-CM | POA: Diagnosis present

## 2022-07-03 DIAGNOSIS — Z20822 Contact with and (suspected) exposure to covid-19: Secondary | ICD-10-CM | POA: Diagnosis present

## 2022-07-03 DIAGNOSIS — Z933 Colostomy status: Secondary | ICD-10-CM

## 2022-07-03 MED ORDER — OLANZAPINE 10 MG PO TABS
10.0000 mg | ORAL_TABLET | Freq: Two times a day (BID) | ORAL | Status: DC
  Administered 2022-07-03 – 2022-07-04 (×2): 10 mg via ORAL
  Filled 2022-07-03 (×2): qty 1

## 2022-07-03 MED ORDER — ACETAMINOPHEN 325 MG PO TABS: 650.0000 mg | ORAL_TABLET | Freq: Four times a day (QID) | ORAL | Status: DC | PRN

## 2022-07-03 MED ORDER — ALUM & MAG HYDROXIDE-SIMETH 200-200-20 MG/5ML PO SUSP: 30.0000 mL | ORAL | Status: DC | PRN

## 2022-07-03 MED ORDER — ONDANSETRON HCL 4 MG PO TABS: 4.0000 mg | ORAL_TABLET | Freq: Three times a day (TID) | ORAL | Status: DC | PRN

## 2022-07-03 MED ORDER — BUPROPION HCL ER (XL) 150 MG PO TB24: 150.0000 mg | ORAL_TABLET | Freq: Every day | ORAL | Status: DC

## 2022-07-03 MED ORDER — DIVALPROEX SODIUM ER 500 MG PO TB24: 500.0000 mg | ORAL_TABLET | Freq: Every day | ORAL | Status: DC

## 2022-07-03 MED ORDER — RISPERIDONE 1 MG PO TBDP: 2.0000 mg | ORAL_TABLET | Freq: Three times a day (TID) | ORAL | Status: DC | PRN

## 2022-07-03 MED ORDER — MAGNESIUM HYDROXIDE 400 MG/5ML PO SUSP: 30.0000 mL | Freq: Every day | ORAL | Status: DC | PRN

## 2022-07-03 MED ORDER — ZIPRASIDONE MESYLATE 20 MG IM SOLR: 20.0000 mg | Freq: Two times a day (BID) | INTRAMUSCULAR | Status: DC | PRN

## 2022-07-03 MED ORDER — IBUPROFEN 600 MG PO TABS: 600.0000 mg | ORAL_TABLET | Freq: Three times a day (TID) | ORAL | Status: DC | PRN

## 2022-07-03 MED ORDER — DIVALPROEX SODIUM 500 MG PO DR TAB
500.0000 mg | DELAYED_RELEASE_TABLET | Freq: Two times a day (BID) | ORAL | Status: DC
  Administered 2022-07-03 – 2022-07-04 (×2): 500 mg via ORAL
  Filled 2022-07-03 (×2): qty 1
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
## 2022-07-03 NOTE — ED Notes (Signed)
Patient off unit to facility per provider. Patient alert, calm, cooperative, no s/s of distress at this time. Patient discharge information and belongings given to [facility] for transport. Patient ambulatory off unit, escorted and transported by [facility]

## 2022-07-03 NOTE — ED Provider Notes (Signed)
Emergency Medicine Observation Re-evaluation Note  Dalton Moore is a 26 y.o. male, seen on rounds today.  Pt initially presented to the ED for complaints of Psychiatric Evaluation Currently, the patient is resting  Physical Exam  BP 131/89 (BP Location: Left Arm)   Pulse 75   Temp 97.7 F (36.5 C) (Oral)   Resp 18   SpO2 99%  Physical Exam   ED Course / MDM  EKG: I have reviewed the labs performed to date as well as medications administered while in observation.  Recent changes in the last 24 hours include none.  Plan  Current plan is for patient to go to [facility].    Lorre Nick, MD 07/03/22 [phone]

## 2022-07-03 NOTE — [facility] Suicide Risk Assessment (Signed)
[facility] Admission Suicide Risk Assessment   Nursing information obtained from:    Demographic factors:    Current Mental Status:    Loss Factors:    Historical Factors:    Risk Reduction Factors:     Total Time spent with patient: 45 minutes Principal Problem: Schizophrenia, undifferentiated (HCC) Diagnosis:  Principal Problem:   Schizophrenia, undifferentiated (HCC) Active Problems:   Colostomy present (HCC)  Subjective Data: Patient seen and chart reviewed.  26 year old man with a history of schizophrenia who had presented over a week ago to the emergency room in [place] with psychosis.  Now transferred to our inpatient psychiatric unit.  Patient denies suicidal ideation.  There was no reported suicidal ideation earlier in his stay.  He denies any past history of suicide attempts.  Currently appears to be lucid and rational denying hallucinations.  Behavior is calm.  Agreeable to medication.  Continued Clinical Symptoms:    The "Alcohol Use Disorders Identification Test", Guidelines for Use in Primary Care, Second Edition.  World Science writer [facility]). Score between 0-7:  no or low risk or alcohol related problems. Score between 8-15:  moderate risk of alcohol related problems. Score between 16-19:  high risk of alcohol related problems. Score 20 or above:  warrants further diagnostic evaluation for alcohol dependence and treatment.   CLINICAL FACTORS:   Schizophrenia:   Less than 25 years old   Musculoskeletal: Strength & Muscle Tone: within normal limits Gait & Station: normal Patient leans: N/A  Psychiatric Specialty Exam:  Presentation  General Appearance:  Casual; Fairly Groomed  Eye Contact: Good  Speech: Clear and Coherent; Normal Rate  Speech Volume: Normal  Handedness: Right   Mood and Affect  Mood: Depressed  Affect: Congruent; Depressed   Thought Process  Thought Processes: Disorganized; Irrevelant  Descriptions of  Associations:Loose  Orientation:Full (Time, Place and Person)  Thought Content:Illogical; Logical  History of Schizophrenia/Schizoaffective disorder:Yes  Duration of Psychotic Symptoms:Greater than six months  Hallucinations:No data recorded Ideas of Reference:None  Suicidal Thoughts:No data recorded Homicidal Thoughts:No data recorded  Sensorium  Memory: Immediate Fair; Recent Fair; Remote Fair  Judgment: Impaired  Insight: Shallow   Executive Functions  Concentration: Fair  Attention Span: Fair  Recall: Fair  Fund of Knowledge: Poor  Language: Good   Psychomotor Activity  Psychomotor Activity:No data recorded  Assets  Assets: Communication Skills; Social Support   Sleep  Sleep:No data recorded   Physical Exam: Physical Exam Vitals and nursing note reviewed.  Constitutional:      Appearance: Normal appearance.  HENT:     Head: Normocephalic and atraumatic.     Mouth/Throat:     Pharynx: Oropharynx is clear.  Eyes:     Pupils: Pupils are equal, round, and reactive to light.  Cardiovascular:     Rate and Rhythm: Normal rate and regular rhythm.  Pulmonary:     Effort: Pulmonary effort is normal.     Breath sounds: Normal breath sounds.  Abdominal:     General: Abdomen is flat.     Palpations: Abdomen is soft.    Musculoskeletal:        General: Normal range of motion.  Skin:    General: Skin is warm and dry.  Neurological:     General: No focal deficit present.     Mental Status: He is alert. Mental status is at baseline.  Psychiatric:        Attention and Perception: He is inattentive.        Mood and  Affect: Mood normal. Affect is blunt.        Speech: Speech normal.        Behavior: Behavior is cooperative.        Thought Content: Thought content normal.        Cognition and Memory: Memory is impaired.    Review of Systems  Constitutional: Negative.   HENT: Negative.    Eyes: Negative.   Respiratory: Negative.     Cardiovascular: Negative.   Gastrointestinal: Negative.   Musculoskeletal: Negative.   Skin: Negative.   Neurological: Negative.   Psychiatric/Behavioral: Negative.     Blood pressure 137/82, pulse 80, temperature 98.2 F (36.8 C), temperature source Oral, resp. rate 16, height 6\' 1"  (1.854 m), weight 75.8 kg, SpO2 100 %. Body mass index is 22.03 kg/m.   COGNITIVE FEATURES THAT CONTRIBUTE TO RISK:  None    SUICIDE RISK:   Minimal: No identifiable suicidal ideation.  Patients presenting with no risk factors but with morbid ruminations; may be classified as minimal risk based on the severity of the depressive symptoms  PLAN OF CARE: Continue 15-minute checks.  Continue medication.  Treatment team to evaluate and work with him on appropriate disposition.  Ongoing assessment of dangerousness prior to discharge  I certify that inpatient services furnished can reasonably be expected to improve the patient's condition.   , MD 07/03/2022, 1:19 PM

## 2022-07-03 NOTE — Group Note (Signed)
[facility] LCSW Group Therapy Note   Group Date: 07/03/2022 Start Time: 1300 End Time: 1400   Type of Therapy/Topic:  Group Therapy:  Balance in Life  Participation Level:  Did Not Attend   Description of Group:    This group will address the concept of balance and how it feels and looks when one is unbalanced. Patients will be encouraged to process areas in their lives that are out of balance, and identify reasons for remaining unbalanced. Facilitators will guide patients utilizing problem- solving interventions to address and correct the stressor making their life unbalanced. Understanding and applying boundaries will be explored and addressed for obtaining  and maintaining a balanced life. Patients will be encouraged to explore ways to assertively make their unbalanced needs known to significant others in their lives, using other group members and facilitator for support and feedback.  Therapeutic Goals: Patient will identify two or more emotions or situations they have that consume much of in their lives. Patient will identify signs/triggers that life has become out of balance:  Patient will identify two ways to set boundaries in order to achieve balance in their lives:  Patient will demonstrate ability to communicate their needs through discussion and/or role plays  Summary of Patient Progress:    Group unable to be held due to complex discharges and discharges planning. CSW provided patient with worksheets.    Therapeutic Modalities:   Cognitive Behavioral Therapy Solution-Focused Therapy Assertiveness Training   Warren Kugelman J Rayonna Heldman, LCSW 

## 2022-07-03 NOTE — ED Notes (Signed)
Pt has hollister colostomy bag 18006 102 mm 4 in,  flex wear kit [id number].

## 2022-07-03 NOTE — ED Notes (Signed)
Dalton Moore c/o mild indigestion and request ginger ale which he report effective for relief.

## 2022-07-03 NOTE — Progress Notes (Signed)
Recreation Therapy Notes   Date: 07/03/2022  Time: 2:15pm  Location: Courtyard   Behavioral response: N/A   Group Type: Recreation and Leisure  Participation level: N/A  Communication: Patient refused to attend group.   Comments: N/A  Nykiah Ma LRT/CTRS        Babara Buffalo 07/03/2022 3:58 PM 

## 2022-07-03 NOTE — Plan of Care (Signed)
Pt endorses anxiety however, denies depression at this time. Pt denies SI/HI/AVH or pain at this time. Pt is calm and cooperative. Pt is medication compliant. Pt provided with support and encouragement. Pt monitored q15 minutes for safety per unit policy. Plan of care ongoing.   Pt verbalizes his desire to discharge. Pt asks if this Clinical research associate believes he will go home tomorrow. This Clinical research associate stated that would be a discussion he needed to have with the doctor. The pt stated the doctor told him he was possibly going home tomorrow.   Problem: Nutrition: Goal: Adequate nutrition will be maintained Outcome: Progressing   Problem: Pain Managment: Goal: General experience of comfort will improve Outcome: Progressing

## 2022-07-03 NOTE — Tx Team (Signed)
Initial Treatment Plan 07/03/2022 12:08 PM Bing Quarry BJY:[id number]    PATIENT STRESSORS: Educational concerns   Financial difficulties   Medication change or noncompliance   Occupational concerns   Substance abuse   Traumatic event     PATIENT STRENGTHS: Printmaker for treatment/growth  Supportive family/friends    PATIENT IDENTIFIED PROBLEMS:                      DISCHARGE CRITERIA:  Ability to meet basic life and health needs Adequate post-discharge living arrangements Improved stabilization in mood, thinking, and/or behavior Medical problems require only outpatient monitoring Motivation to continue treatment in a less acute level of care Reduction of life-threatening or endangering symptoms to within safe limits Safe-care adequate arrangements made Verbal commitment to aftercare and medication compliance  PRELIMINARY DISCHARGE PLAN: Outpatient therapy Return to previous living arrangement  PATIENT/FAMILY INVOLVEMENT: This treatment plan has been presented to and reviewed with the patient, Dalton Moore, and/or family member, .  The patient and family have been given the opportunity to ask questions and make suggestions.  Dalton Haring, RN 07/03/2022, 12:08 PM

## 2022-07-04 ENCOUNTER — Other Ambulatory Visit: Payer: Self-pay

## 2022-07-04 LAB — LIPID PANEL
Cholesterol: 154 mg/dL (ref 0–200)
HDL: 63 mg/dL (ref >40)
LDL Cholesterol: 70 mg/dL (ref 0–99)
Total CHOL/HDL Ratio: 2.4 RATIO
Triglycerides: 107 mg/dL (ref <150)
VLDL: 21 mg/dL (ref 0–40)

## 2022-07-04 LAB — HEMOGLOBIN A1C
Hgb A1c MFr Bld: 5.5 % (ref 4.8–5.6)
Mean Plasma Glucose: 111 mg/dL

## 2022-07-04 MED ORDER — DIVALPROEX SODIUM 500 MG PO DR TAB: 500.0000 mg | DELAYED_RELEASE_TABLET | Freq: Two times a day (BID) | ORAL | 1 refills | Status: DC

## 2022-07-04 MED ORDER — OLANZAPINE 10 MG PO TABS: 10.0000 mg | ORAL_TABLET | Freq: Two times a day (BID) | ORAL | 1 refills | Status: DC

## 2022-07-04 MED ORDER — OLANZAPINE 10 MG PO TABS
10.0000 mg | ORAL_TABLET | Freq: Two times a day (BID) | ORAL | 0 refills | Status: AC
  Filled 2022-07-04: qty 20, 10d supply, fill #0

## 2022-07-04 MED ORDER — DIVALPROEX SODIUM 500 MG PO DR TAB
500.0000 mg | DELAYED_RELEASE_TABLET | Freq: Two times a day (BID) | ORAL | 0 refills | Status: AC
  Filled 2022-07-04: qty 20, 10d supply, fill #0

## 2022-07-04 NOTE — BH IP Treatment Plan (Signed)
Interdisciplinary Treatment and Diagnostic Plan Update  07/04/2022 Time of Session: 9:30AM Dalton Moore MRN: [id number]  Principal Diagnosis: Schizophrenia, undifferentiated ([place])  Secondary Diagnoses: Principal Problem:   Schizophrenia, undifferentiated ([place]) Active Problems:   Colostomy present ([place])   Current Medications:  Current Facility-Administered Medications  Medication Dose Route Frequency Provider Last Rate Last Admin   acetaminophen (TYLENOL) tablet 650 mg  650 mg Oral Q6H PRN Clapacs, Madie Reno, MD       alum & mag hydroxide-simeth (MAALOX/MYLANTA) 200-200-20 MG/5ML suspension 30 mL  30 mL Oral Q4H PRN Clapacs, Madie Reno, MD       divalproex (DEPAKOTE) DR tablet 500 mg  500 mg Oral Q12H Clapacs, John T, MD   500 mg at 07/04/22 0805   ibuprofen (ADVIL) tablet 600 mg  600 mg Oral Q8H PRN Clapacs, Madie Reno, MD       magnesium hydroxide (MILK OF MAGNESIA) suspension 30 mL  30 mL Oral Daily PRN Clapacs, Madie Reno, MD       OLANZapine (ZYPREXA) tablet 10 mg  10 mg Oral BID Clapacs, Madie Reno, MD   10 mg at 07/04/22 0805   ondansetron (ZOFRAN) tablet 4 mg  4 mg Oral Q8H PRN Clapacs, John T, MD       risperiDONE (RISPERDAL M-TABS) disintegrating tablet 2 mg  2 mg Oral Q8H PRN Clapacs, John T, MD       ziprasidone (GEODON) injection 20 mg  20 mg Intramuscular Q12H PRN Clapacs, Madie Reno, MD       PTA Medications: Medications Prior to Admission  Medication Sig Dispense Refill Last Dose   buPROPion (WELLBUTRIN XL) 150 MG 24 hr tablet Take 1 tablet (150 mg total) by mouth daily. (Patient not taking: Reported on 11/20/2021) 30 tablet 0    OLANZapine (ZYPREXA) 10 MG tablet Take 1 tablet (10 mg total) by mouth 2 (two) times daily. (Patient not taking: Reported on 06/26/2022) 60 tablet 1    sodium chloride flush (NS) 0.9 % SOLN 5 mLs by Intracatheter route daily. (Patient not taking: Reported on 03/12/2022) 15 Syringe 1     Patient Stressors: Educational concerns   Financial difficulties   Medication  change or noncompliance   Occupational concerns   Substance abuse   Traumatic event    Patient Strengths: Hydrographic surveyor for treatment/growth  Supportive family/friends   Treatment Modalities: Medication Management, Group therapy, Case management,  1 to 1 session with clinician, Psychoeducation, Recreational therapy.   Physician Treatment Plan for Primary Diagnosis: Schizophrenia, undifferentiated ([place]) Long Term Goal(s): Improvement in symptoms so as ready for discharge   Short Term Goals: Ability to maintain clinical measurements within normal limits will improve Compliance with prescribed medications will improve Ability to verbalize feelings will improve Ability to demonstrate self-control will improve Ability to identify and develop effective coping behaviors will improve  Medication Management: Evaluate patient's response, side effects, and tolerance of medication regimen.  Therapeutic Interventions: 1 to 1 sessions, Unit Group sessions and Medication administration.  Evaluation of Outcomes: Adequate for Discharge  Physician Treatment Plan for Secondary Diagnosis: Principal Problem:   Schizophrenia, undifferentiated ([place]) Active Problems:   Colostomy present ([place])  Long Term Goal(s): Improvement in symptoms so as ready for discharge   Short Term Goals: Ability to maintain clinical measurements within normal limits will improve Compliance with prescribed medications will improve Ability to verbalize feelings will improve Ability to demonstrate self-control will improve Ability to identify and develop effective coping behaviors will improve  Medication Management: Evaluate patient's response, side effects, and tolerance of medication regimen.  Therapeutic Interventions: 1 to 1 sessions, Unit Group sessions and Medication administration.  Evaluation of Outcomes: Adequate for Discharge   RN Treatment Plan for Primary Diagnosis: Schizophrenia,  undifferentiated ([place]) Long Term Goal(s): Knowledge of disease and therapeutic regimen to maintain health will improve  Short Term Goals: Ability to verbalize frustration and anger appropriately will improve, Ability to demonstrate self-control, Ability to participate in decision making will improve, Ability to verbalize feelings will improve, Ability to disclose and discuss suicidal ideas, Ability to identify and develop effective coping behaviors will improve, and Compliance with prescribed medications will improve  Medication Management: RN will administer medications as ordered by provider, will assess and evaluate patient's response and provide education to patient for prescribed medication. RN will report any adverse and/or side effects to prescribing provider.  Therapeutic Interventions: 1 on 1 counseling sessions, Psychoeducation, Medication administration, Evaluate responses to treatment, Monitor vital signs and CBGs as ordered, Perform/monitor CIWA, COWS, AIMS and Fall Risk screenings as ordered, Perform wound care treatments as ordered.  Evaluation of Outcomes: Adequate for Discharge   LCSW Treatment Plan for Primary Diagnosis: Schizophrenia, undifferentiated ([place]) Long Term Goal(s): Safe transition to appropriate next level of care at discharge, Engage patient in therapeutic group addressing interpersonal concerns.  Short Term Goals: Engage patient in aftercare planning with referrals and resources, Increase social support, Increase ability to appropriately verbalize feelings, Increase emotional regulation, Facilitate acceptance of mental health diagnosis and concerns, and Increase skills for wellness and recovery  Therapeutic Interventions: Assess for all discharge needs, 1 to 1 time with Social worker, Explore available resources and support systems, Assess for adequacy in community support network, Educate family and significant other(s) on suicide prevention, Complete Psychosocial  Assessment, Interpersonal group therapy.  Evaluation of Outcomes: Adequate for Discharge   Progress in Treatment: Attending groups: No. Participating in groups: No. Taking medication as prescribed: Yes. Toleration medication: Yes. Family/Significant other contact made: No, will contact:  once permission is given. Patient understands diagnosis: Yes. Discussing patient identified problems/goals with staff: Yes. Medical problems stabilized or resolved: Yes. Denies suicidal/homicidal ideation: Yes. Issues/concerns per patient self-inventory: No. Other: none  New problem(s) identified: No, Describe:  none  New Short Term/Long Term Goal(s): detox, elimination of symptoms of psychosis, medication management for mood stabilization; elimination of SI thoughts; development of comprehensive mental wellness/sobriety plan.   Patient Goals:  "I've already met them to be honest with you"  Discharge Plan or Barriers: CSW to meet with the patient and complete appropriate discharge plans. Patient appears open to referral for outpatient treatment and reports that he will return home.   Reason for Continuation of Hospitalization: Aggression Anxiety Depression Medical Issues Medication stabilization  Estimated Length of Stay:  1-7 days  Last 3 [place] Suicide Severity Risk Score: [place] Admission (Current) from 07/03/2022 in [place] ED from 06/26/2022 in [place] [facility] ED from 03/30/2022 in [place] [facility]  C-SSRS RISK CATEGORY No Risk No Risk Error: Q3, 4, or 5 should not be populated when Q2 is No       Last PHQ 2/9 Scores:    03/12/2022    6:13 PM  Depression screen PHQ 2/9  Decreased Interest 1  Down, Depressed, Hopeless 1  PHQ - 2 Score 2  Altered sleeping 0  Tired, decreased energy 1  Change in appetite 0  Feeling bad or failure about yourself  1  Trouble  concentrating 1  Moving slowly  or fidgety/restless 1  Suicidal thoughts 1  PHQ-9 Score 7  Difficult doing work/chores Somewhat difficult    Scribe for Treatment Team: Rozann Lesches, Marlinda Mike 07/04/2022 9:56 AM

## 2022-07-04 NOTE — Progress Notes (Signed)
Recreation Therapy Notes  Date: 07/04/2022  Time: 10:45 am     Location: Craft room   Behavioral response: N/A   Intervention Topic: Leisure  Discussion/Intervention: Patient refused to attend group.   Clinical Observations/Feedback:  Patient refused to attend group.    Lene Mckay LRT/CTRS        Debie Ashline 07/04/2022 12:45 PM

## 2022-07-04 NOTE — Plan of Care (Signed)
  Problem: Education: Goal: Knowledge of General Education information will improve Description: Including pain rating scale, medication(s)/side effects and non-pharmacologic comfort measures Outcome: Adequate for Discharge   Problem: Health Behavior/Discharge Planning: Goal: Ability to manage health-related needs will improve Outcome: Adequate for Discharge   Problem: Clinical Measurements: Goal: Ability to maintain clinical measurements within normal limits will improve Outcome: Adequate for Discharge Goal: Will remain free from infection Outcome: Adequate for Discharge Goal: Diagnostic test results will improve Outcome: Adequate for Discharge Goal: Respiratory complications will improve Outcome: Adequate for Discharge Goal: Cardiovascular complication will be avoided Outcome: Adequate for Discharge   Problem: Activity: Goal: Risk for activity intolerance will decrease Outcome: Adequate for Discharge   Problem: Nutrition: Goal: Adequate nutrition will be maintained Outcome: Adequate for Discharge   Problem: Coping: Goal: Level of anxiety will decrease Outcome: Adequate for Discharge   Problem: Elimination: Goal: Will not experience complications related to bowel motility Outcome: Adequate for Discharge Goal: Will not experience complications related to urinary retention Outcome: Adequate for Discharge   Problem: Pain Managment: Goal: General experience of comfort will improve Outcome: Adequate for Discharge   Problem: Safety: Goal: Ability to remain free from injury will improve Outcome: Adequate for Discharge   Problem: Skin Integrity: Goal: Risk for impaired skin integrity will decrease Outcome: Adequate for Discharge   Problem: Education: Goal: Knowledge of [place] [facility] Education information/materials will improve 07/04/2022 1032 by Hyman Hopes, RN Outcome: Adequate for Discharge 07/04/2022 5427 by Hyman Hopes, RN Outcome:  Progressing Goal: Emotional status will improve Outcome: Adequate for Discharge Goal: Mental status will improve Outcome: Adequate for Discharge Goal: Verbalization of understanding the information provided will improve 07/04/2022 1032 by Hyman Hopes, RN Outcome: Adequate for Discharge 07/04/2022 0828 by Hyman Hopes, RN Outcome: Progressing   Problem: Activity: Goal: Interest or engagement in activities will improve Outcome: Adequate for Discharge Goal: Sleeping patterns will improve Outcome: Adequate for Discharge   Problem: Coping: Goal: Ability to verbalize frustrations and anger appropriately will improve Outcome: Adequate for Discharge Goal: Ability to demonstrate self-control will improve Outcome: Adequate for Discharge   Problem: Health Behavior/Discharge Planning: Goal: Identification of resources available to assist in meeting health care needs will improve Outcome: Adequate for Discharge Goal: Compliance with treatment plan for underlying cause of condition will improve 07/04/2022 1032 by Hyman Hopes, RN Outcome: Adequate for Discharge 07/04/2022 0623 by Hyman Hopes, RN Outcome: Progressing   Problem: Physical Regulation: Goal: Ability to maintain clinical measurements within normal limits will improve 07/04/2022 1032 by Hyman Hopes, RN Outcome: Adequate for Discharge 07/04/2022 7628 by Hyman Hopes, RN Outcome: Progressing   Problem: Safety: Goal: Periods of time without injury will increase 07/04/2022 1032 by Hyman Hopes, RN Outcome: Adequate for Discharge 07/04/2022 3151 by Hyman Hopes, RN Outcome: Progressing

## 2022-07-04 NOTE — Progress Notes (Signed)
Patient ID: Dalton Moore, male   DOB: 1996/07/16, 26 y.o.   MRN: [id number] Patient denies SI/HI/AVH. Belongings were returned to patient. Discharge instructions  including medication and follow up information were reviewed with patient and understanding was verbalized. Patient was not observed to be in distress at time of discharge. Patient was escorted out with staff to medical mall to be transported home by family member.

## 2022-07-04 NOTE — B[facility] Suicide Risk Assessment (Signed)
[facility] Discharge Suicide Risk Assessment   Principal Problem: Schizophrenia, undifferentiated ([facility]) Discharge Diagnoses: Principal Problem:   Schizophrenia, undifferentiated ([facility]) Active Problems:   Colostomy present ([facility])   Total Time spent with patient: 30 minutes  Musculoskeletal: Strength & Muscle Tone: within normal limits Gait & Station: normal Patient leans: N/A  Psychiatric Specialty Exam  Presentation  General Appearance:  Casual; Fairly Groomed  Eye Contact: Good  Speech: Clear and Coherent; Normal Rate  Speech Volume: Normal  Handedness: Right   Mood and Affect  Mood: Depressed  Duration of Depression Symptoms: No data recorded Affect: Congruent; Depressed   Thought Process  Thought Processes: Disorganized; Irrevelant  Descriptions of Associations:Loose  Orientation:Full (Time, Place and Person)  Thought Content:Illogical; Logical  History of Schizophrenia/Schizoaffective disorder:Yes  Duration of Psychotic Symptoms:Greater than six months  Hallucinations:No data recorded Ideas of Reference:None  Suicidal Thoughts:No data recorded Homicidal Thoughts:No data recorded  Sensorium  Memory: Immediate Fair; Recent Fair; Remote Fair  Judgment: Impaired  Insight: Shallow   Executive Functions  Concentration: Fair  Attention Span: Fair  Recall: Fair  Fund of Knowledge: Poor  Language: Good   Psychomotor Activity  Psychomotor Activity:No data recorded  Assets  Assets: Communication Skills; Social Support   Sleep  Sleep:No data recorded  Physical Exam: Physical Exam Vitals and nursing note reviewed.  Constitutional:      Appearance: Normal appearance.  HENT:     Head: Normocephalic and atraumatic.     Mouth/Throat:     Pharynx: Oropharynx is clear.  Eyes:     Pupils: Pupils are equal, round, and reactive to light.  Cardiovascular:     Rate and Rhythm: Normal rate and regular rhythm.  Pulmonary:      Effort: Pulmonary effort is normal.     Breath sounds: Normal breath sounds.  Abdominal:     General: Abdomen is flat.     Palpations: Abdomen is soft.  Musculoskeletal:        General: Normal range of motion.  Skin:    General: Skin is warm and dry.  Neurological:     General: No focal deficit present.     Mental Status: He is alert. Mental status is at baseline.  Psychiatric:        Attention and Perception: Attention normal.        Mood and Affect: Mood normal.        Speech: Speech normal.        Behavior: Behavior is cooperative.        Thought Content: Thought content normal.        Cognition and Memory: Cognition normal.    Review of Systems  Constitutional: Negative.   HENT: Negative.    Eyes: Negative.   Respiratory: Negative.    Cardiovascular: Negative.   Gastrointestinal: Negative.   Musculoskeletal: Negative.   Skin: Negative.   Neurological: Negative.   Psychiatric/Behavioral: Negative.     Blood pressure 128/88, pulse 74, temperature 98.2 F (36.8 C), temperature source Oral, resp. rate 16, height 6\' 1"  (1.854 m), weight 75.8 kg, SpO2 100 %. Body mass index is 22.03 kg/m.  Mental Status Per Nursing Assessment::   On Admission:  NA  Demographic Factors:  Male  Loss Factors: Decline in physical health  Historical Factors: Impulsivity  Risk Reduction Factors:   Positive therapeutic relationship  Continued Clinical Symptoms:  Schizophrenia:   Paranoid or undifferentiated type  Cognitive Features That Contribute To Risk:  None    Suicide Risk:  Minimal: No identifiable  suicidal ideation.  Patients presenting with no risk factors but with morbid ruminations; may be classified as minimal risk based on the severity of the depressive symptoms    Plan Of Care/Follow-up recommendations:  Other:  Patient seen and chart reviewed.  Denies all suicidal ideation.  Affect is upbeat.  Thoughts appear to be fully lucid with no signs of paranoia or  agitation.  Completely denies suicidal ideation and agrees to appropriate outpatient treatment  Mordecai Rasmussen, MD 07/04/2022, 10:20 AM

## 2022-07-04 NOTE — Discharge Summary (Signed)
Physician Discharge Summary Note  Patient:  Dalton Moore is an 26 y.o., male MRN:  [id number] DOB:  1996/06/20 Patient phone:  [phone] (home)  Patient address:   [place] [address],  Total Time spent with patient: 30 minutes  Date of Admission:  07/03/2022 Date of Discharge: 07/04/2022  Reason for Admission: Patient seen and chart reviewed.  Patient admitted to the hospital transferred from [facility] where he had been treated for several days for psychosis  Principal Problem: Schizophrenia, undifferentiated (HCC) Discharge Diagnoses: Principal Problem:   Schizophrenia, undifferentiated (HCC) Active Problems:   Colostomy present [facility])   Past Psychiatric History: Past history of psychosis schizophrenia with substance use involved  Past Medical History:  Past Medical History:  Diagnosis Date   Depression    PTSD (post-traumatic stress disorder)    Schizophrenia (HCC)     Past Surgical History:  Procedure Laterality Date   COLOSTOMY N/A 11/05/2021   Procedure: COLOSTOMY;  Surgeon: Diamantina Monks, MD;  Location: [facility] OR;  Service: General;  Laterality: N/A;   IR RADIOLOGIST EVAL & MGMT  12/11/2021   LAPAROTOMY N/A 11/05/2021   Procedure: EXPLORATORY LAPAROTOMY;  Surgeon: Diamantina Monks, MD;  Location: [facility] OR;  Service: General;  Laterality: N/A;   PARTIAL COLECTOMY N/A 11/05/2021   Procedure: PARTIAL COLECTOMY WITH REMOVAL SPLENIC FLEXURE;  Surgeon: Diamantina Monks, MD;  Location: [facility] OR;  Service: General;  Laterality: N/A;   SPLENECTOMY, 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 No pertinent family history. Family Psychiatric  History: See previous Social History:  Social History   Substance and Sexual Activity  Alcohol Use Yes     Social History   Substance and Sexual Activity  Drug Use Yes   Types: Cocaine, Benzodiazepines,  Marijuana    Social History   Socioeconomic History   Marital status: Single    Spouse name: Not on file   Number of children: Not on file   Years of education: Not on file   Highest education level: Not on file  Occupational History   Occupation: Unemployed  Tobacco Use   Smoking status: Every Day    Types: Cigarettes, Cigars    Passive exposure: Current   Smokeless tobacco: Current  Vaping Use   Vaping Use: Every day   Substances: Nicotine, THC, Synthetic cannabinoids  Substance and Sexual Activity   Alcohol use: Yes   Drug use: Yes    Types: Cocaine, Benzodiazepines, Marijuana   Sexual activity: Not Currently  Other Topics Concern   Not on file  Social History Narrative   ** Merged History Encounter **       Social Determinants of Health   Financial Resource Strain: Not on file  Food Insecurity: Unknown (07/03/2022)   Hunger Vital Sign    Worried About Running Out of Food in the Last Year: Not on file    Ran Out of Food in the Last Year: Never true  Transportation Needs: No Transportation Needs (07/03/2022)   PRAPARE - Administrator, Civil Service (Medical): No    Lack of Transportation (Non-Medical): No  Physical Activity: Not on file  Stress: Not on file  Social Connections: Not on file    Hospital Course: Patient admitted to psychiatric hospital.  15-minute checks continued.  Patient was cooperative with intake and treatment.  Pleasant and cooperative and is in poor actions.  Attended treatment team today.  He denies all suicidal or homicidal ideation.  States that he has every intention of continuing to take his medicine and follow-up with outpatient providers.  Acknowledge the recommendations for outpatient treatment and discontinuation of substance use.  Did not appear to be having any psychotic symptoms at this point or to meet commitment criteria.  Patient will be discharged today and given a supply of medicines as well as prescriptions and referred  to options for follow-up in [facility].  Physical Findings: AIMS:  , ,  ,  ,    CIWA:    COWS:     Musculoskeletal: Strength & Muscle Tone: within normal limits Gait & Station: normal Patient leans: N/A   Psychiatric Specialty Exam:  Presentation  General Appearance:  Casual; Fairly Groomed  Eye Contact: Good  Speech: Clear and Coherent; Normal Rate  Speech Volume: Normal  Handedness: Right   Mood and Affect  Mood: Depressed  Affect: Congruent; Depressed   Thought Process  Thought Processes: Disorganized; Irrevelant  Descriptions of Associations:Loose  Orientation:Full (Time, Place and Person)  Thought Content:Illogical; Logical  History of Schizophrenia/Schizoaffective disorder:Yes  Duration of Psychotic Symptoms:Greater than six months  Hallucinations:No data recorded Ideas of Reference:None  Suicidal Thoughts:No data recorded Homicidal Thoughts:No data recorded  Sensorium  Memory: Immediate Fair; Recent Fair; Remote Fair  Judgment: Impaired  Insight: Shallow   Executive Functions  Concentration: Fair  Attention Span: Fair  Recall: Fair  Fund of Knowledge: Poor  Language: Good   Psychomotor Activity  Psychomotor Activity:No data recorded  Assets  Assets: Communication Skills; Social Support   Sleep  Sleep:No data recorded   Physical Exam: Physical Exam Vitals and nursing note reviewed.  Constitutional:      Appearance: Normal appearance.  HENT:     Head: Normocephalic and atraumatic.     Mouth/Throat:     Pharynx: Oropharynx is clear.  Eyes:     Pupils: Pupils are equal, round, and reactive to light.  Cardiovascular:     Rate and Rhythm: Normal rate and regular rhythm.  Pulmonary:     Effort: Pulmonary effort is normal.     Breath sounds: Normal breath sounds.  Abdominal:     General: Abdomen is flat.     Palpations: Abdomen is soft.    Musculoskeletal:        General: Normal range of  motion.  Skin:    General: Skin is warm and dry.  Neurological:     General: No focal deficit present.     Mental Status: He is alert. Mental status is at baseline.  Psychiatric:        Attention and Perception: Attention normal.        Mood and Affect: Mood normal.        Speech: Speech normal.        Behavior: Behavior normal.        Thought Content: Thought content normal.        Cognition and Memory: Cognition normal.        Judgment: Judgment normal.    Review of Systems  Constitutional: Negative.   HENT: Negative.    Eyes: Negative.   Respiratory: Negative.    Cardiovascular: Negative.   Gastrointestinal: Negative.   Musculoskeletal: Negative.   Skin: Negative.   Neurological: Negative.   Psychiatric/Behavioral: Negative.     Blood pressure 128/88, pulse 74, temperature 98.2 F (36.8 C), temperature source Oral, resp. rate 16, height 6\' 1"  (1.854 m), weight 75.8  kg, SpO2 100 %. Body mass index is 22.03 kg/m.   Social History   Tobacco Use  Smoking Status Every Day   Types: Cigarettes, Cigars   Passive exposure: Current  Smokeless Tobacco Current   Tobacco Cessation:  A prescription for an FDA-approved tobacco cessation medication was offered at discharge and the patient refused   Blood Alcohol level:  Lab Results  Component Value Date   ETH <10 06/26/2022   ETH <10 03/30/2022    Metabolic Disorder Labs:  No results found for: "HGBA1C", "MPG" No results found for: "PROLACTIN" Lab Results  Component Value Date   CHOL 154 07/04/2022   TRIG 107 07/04/2022   HDL 63 07/04/2022   CHOLHDL 2.4 07/04/2022   VLDL 21 07/04/2022   LDLCALC 70 07/04/2022    See Psychiatric Specialty Exam and Suicide Risk Assessment completed by Attending Physician prior to discharge.  Discharge destination:  Home  Is patient on multiple antipsychotic therapies at discharge:  No   Has Patient had three or more failed trials of antipsychotic monotherapy by history:   No  Recommended Plan for Multiple Antipsychotic Therapies: NA  Discharge Instructions     Diet - low sodium heart healthy   Complete by: As directed    Increase activity slowly   Complete by: As directed       Allergies as of 07/04/2022   No Known Allergies      Medication List     STOP taking these medications    buPROPion 150 MG 24 hr tablet Commonly known as: WELLBUTRIN XL   sodium chloride flush 0.9 % Soln Commonly known as: NS       TAKE these medications      Indication  divalproex 500 MG DR tablet Commonly known as: DEPAKOTE Take 1 tablet (500 mg total) by mouth every 12 (twelve) hours.  Indication: Schizophrenia   OLANZapine 10 MG tablet Commonly known as: ZyPREXA Take 1 tablet (10 mg total) by mouth 2 (two) times daily.  Indication: Schizophrenia         Follow-up recommendations:  Other:  Follow-up with outpatient mental health agencies.  Continue current medicine.  Discontinue use of intoxicants.  Comments: See above  Signed: Mordecai Rasmussen, MD 07/04/2022, 10:24 AM

## 2022-07-04 NOTE — [facility] Suicide Risk Assessment (Signed)
[facility] INPATIENT:  Family/Significant Other Suicide Prevention Education  Suicide Prevention Education:  Patient Refusal for Family/Significant Other Suicide Prevention Education: The patient [place] Moore has refused to provide written consent for family/significant other to be provided Family/Significant Other Suicide Prevention Education during admission and/or prior to discharge.  Physician notified.  SPE completed with pt, as pt refused to consent to family contact. SPI pamphlet provided to pt and pt was encouraged to share information with support network, ask questions, and talk about any concerns relating to SPE. Pt denies access to guns/firearms and verbalized understanding of information provided. Mobile Crisis information also provided to pt.  Glenis Smoker 07/04/2022, 1:37 PM

## 2022-07-04 NOTE — Progress Notes (Signed)
  [facility] Adult Case Management Discharge Plan :  Will you be returning to the same living situation after discharge:  No. At discharge, do you have transportation home?: Yes,  pt family to provide transportation. Do you have the ability to pay for your medications: Yes,  [facility].  Release of information consent forms completed and in the chart;  Patient's signature needed at discharge.  Patient to Follow up at:  Follow-up Information     [facility] Follow up.   Specialty: Behavioral Health Why: They have walk-in hours Monday-Friday, 7:30AM-4PM. Contact information: [address] [place] [address] [phone]                Next level of care provider has access to [facility] Link:no  Safety Planning and Suicide Prevention discussed: Yes,  SPE completed with pt.     Has patient been referred to the Quitline?: Yes, faxed on 07/04/22.  Patient has been referred for addiction treatment: Pt. refused referral  Glenis Smoker, LCSW 07/04/2022, 1:37 PM
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
## 2022-07-08 ENCOUNTER — Ambulatory Visit (HOSPITAL_COMMUNITY)
Admission: RE | Admit: 2022-07-08 | Discharge: 2022-07-08 | Disposition: A | Discharge status: Home / Self Care | Payer: Medicaid Other | Source: Ambulatory Visit | Attending: Nurse Practitioner | Admitting: Nurse Practitioner

## 2022-07-08 DIAGNOSIS — Z91199 Patient's noncompliance with other medical treatment and regimen due to unspecified reason: Secondary | ICD-10-CM | POA: Insufficient documentation

## 2022-07-08 DIAGNOSIS — K9403 Colostomy malfunction: Secondary | ICD-10-CM | POA: Diagnosis present

## 2022-07-08 DIAGNOSIS — Z433 Encounter for attention to colostomy: Secondary | ICD-10-CM | POA: Diagnosis not present

## 2022-07-08 DIAGNOSIS — F209 Schizophrenia, unspecified: Secondary | ICD-10-CM | POA: Diagnosis not present

## 2022-07-08 DIAGNOSIS — L24B3 Irritant contact dermatitis related to fecal or urinary stoma or fistula: Secondary | ICD-10-CM | POA: Insufficient documentation

## 2022-07-08 NOTE — Progress Notes (Signed)
[facility]   Reason for visit:  LLQ colostomy HPI:   Past Medical History:  Diagnosis Date  . Depression   . PTSD (post-traumatic stress disorder)   . Schizophrenia (HCC)    No family history on file. No Known Allergies Current Outpatient Medications  Medication Sig Dispense Refill Last Dose  . divalproex (DEPAKOTE) 500 MG DR tablet Take 1 tablet (500 mg total) by mouth every 12 (twelve) hours. 20 tablet 0   . OLANZapine (ZYPREXA) 10 MG tablet Take 1 tablet (10 mg total) by mouth 2 (two) times daily. 20 tablet 0    No current facility-administered medications for this encounter.   ROS  Review of Systems  Gastrointestinal:        LLQ colostomy  Skin:  Positive for rash.       Peristomal breakdown  Psychiatric/Behavioral:         Noncompliant with care at times Unsheltered  All other systems reviewed and are negative. Vital signs:  BP (!) 145/91 (BP Location: Right Arm)   Pulse 91   Temp 98.4 F (36.9 C) (Oral)   Resp 18   SpO2 99%  Exam:  Physical Exam Vitals reviewed.  Constitutional:      Appearance: Normal appearance.  Abdominal:     Palpations: Abdomen is soft.     Comments: LLQ colostomy  Skin:    Findings: Rash present.  Neurological:     Mental Status: He is alert and oriented to person, place, and time.  Psychiatric:     Comments: schizophrenia    Stoma type/location:  LLQ colostomy Stomal assessment/size:  1 3/8" pink and moist Peristomal assessment:  denuded skin, excess hair trimmed Treatment options for stomal/peristomal skin: barrier ring and 2 piece pouch Output: soft brown stool Ostomy pouching: 2pc. 2 3/4" pouch with barrier ring Education provided:  Here with mother.  Patient set up to have supplies sent to aunt's house and this is now not a reliable place to get supplies. I call [facility] and arrange for supplies to be sent to his mother's home in [place].  This is temporary but they will do it for January and February He  has not had a follow up with [facility]  He would like to pursue reversal.  I call [facility] and we schedule an appointment with Dr Bedelia Person for 12/28 I discuss with patient and mother the importance of quitting smoking prior to surgery, avoiding drugs such as cannabis. I stress the importance of adhering to all of his ordered medications, including psychiatric medications and he agrees.  I stress to patient and mother that he will need a post op plan for where he will reside. They both verbalize understanding.    Impression/dx  Colostomy Contact dermatitis Discussion  Unsheltered, difficulty obtaining supplies.  Plan  See back as needed.     Visit time: 45 minutes.   Maple Hudson FNP-BC

## 2022-07-08 NOTE — Discharge Instructions (Signed)
Supplies given today. New address set up for [facility] in [place] Appointment with [facility] for 12/28 at 11:40.  Be there 11:25 with Medicaid card if you have it

## 2022-07-25 ENCOUNTER — Other Ambulatory Visit: Payer: Self-pay | Admitting: Surgery

## 2022-07-25 DIAGNOSIS — Z933 Colostomy status: Secondary | ICD-10-CM

## 2022-08-12 ENCOUNTER — Ambulatory Visit
Admission: RE | Admit: 2022-08-12 | Discharge: 2022-08-12 | Disposition: A | Discharge status: Home / Self Care | Payer: Medicaid Other | Source: Ambulatory Visit | Attending: Surgery | Admitting: Surgery

## 2022-08-12 DIAGNOSIS — Z933 Colostomy status: Secondary | ICD-10-CM
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
